# Patient Record
Sex: Female | Born: 1985 | Race: Asian | Hispanic: No | Smoking: Never smoker
Health system: Southern US, Community
[De-identification: ages and names within clinical notes are randomized; demographics above are authoritative.]

## PROBLEM LIST (undated history)

## (undated) ENCOUNTER — Inpatient Hospital Stay (HOSPITAL_COMMUNITY): Payer: Self-pay

## (undated) DIAGNOSIS — Z789 Other specified health status: Secondary | ICD-10-CM

## (undated) DIAGNOSIS — S0990XA Unspecified injury of head, initial encounter: Secondary | ICD-10-CM

## (undated) DIAGNOSIS — R55 Syncope and collapse: Secondary | ICD-10-CM

## (undated) DIAGNOSIS — R42 Dizziness and giddiness: Secondary | ICD-10-CM

## (undated) DIAGNOSIS — R519 Headache, unspecified: Secondary | ICD-10-CM

## (undated) HISTORY — DX: Dizziness and giddiness: R42

## (undated) HISTORY — DX: Headache, unspecified: R51.9

## (undated) HISTORY — DX: Unspecified injury of head, initial encounter: S09.90XA

## (undated) HISTORY — DX: Syncope and collapse: R55

## (undated) HISTORY — PX: NO PAST SURGERIES: SHX2092

---

## 2017-03-08 ENCOUNTER — Encounter (HOSPITAL_COMMUNITY): Payer: Self-pay

## 2017-03-08 ENCOUNTER — Inpatient Hospital Stay (HOSPITAL_COMMUNITY)
Admission: AD | Admit: 2017-03-08 | Discharge: 2017-03-09 | Disposition: A | Discharge status: Home / Self Care | Payer: Medicaid Other | Source: Ambulatory Visit | Attending: Obstetrics and Gynecology | Admitting: Obstetrics and Gynecology

## 2017-03-08 DIAGNOSIS — Z3A13 13 weeks gestation of pregnancy: Secondary | ICD-10-CM | POA: Insufficient documentation

## 2017-03-08 DIAGNOSIS — Z3491 Encounter for supervision of normal pregnancy, unspecified, first trimester: Secondary | ICD-10-CM

## 2017-03-08 DIAGNOSIS — O26891 Other specified pregnancy related conditions, first trimester: Secondary | ICD-10-CM | POA: Insufficient documentation

## 2017-03-08 DIAGNOSIS — W07XXXA Fall from chair, initial encounter: Secondary | ICD-10-CM | POA: Diagnosis not present

## 2017-03-08 DIAGNOSIS — R109 Unspecified abdominal pain: Secondary | ICD-10-CM | POA: Insufficient documentation

## 2017-03-08 DIAGNOSIS — O26899 Other specified pregnancy related conditions, unspecified trimester: Secondary | ICD-10-CM

## 2017-03-08 HISTORY — DX: Other specified health status: Z78.9

## 2017-03-08 NOTE — MAU Note (Signed)
Pt states she was at work tonight and fell and landed on her butt. Pt states she now has lower abdominal pain that started about 10 mins later. States the pain got better, but when she got home and finished eating, she started having more pain. Pt denies vaginal bleeding or vaginal discharge.

## 2017-03-09 DIAGNOSIS — R109 Unspecified abdominal pain: Secondary | ICD-10-CM

## 2017-03-09 DIAGNOSIS — O26892 Other specified pregnancy related conditions, second trimester: Secondary | ICD-10-CM

## 2017-03-09 LAB — URINALYSIS, ROUTINE W REFLEX MICROSCOPIC
Bilirubin Urine: NEGATIVE
GLUCOSE, UA: 50 mg/dL — AB
Hgb urine dipstick: NEGATIVE
KETONES UR: 5 mg/dL — AB
Leukocytes, UA: NEGATIVE
NITRITE: NEGATIVE
PH: 6 (ref 5.0–8.0)
Protein, ur: NEGATIVE mg/dL
Specific Gravity, Urine: 1.024 (ref 1.005–1.030)

## 2017-03-09 NOTE — Discharge Instructions (Signed)
Preventing Injuries During Pregnancy °Trauma is the most common cause of injury and death in pregnant women. This can also result in serious harm to the baby or even death. °How can injuries affect my pregnancy? °Your baby is protected in the womb (uterus) by a sac filled with fluid (amniotic sac). Your baby can be harmed if there is a direct blow to your abdomen and pelvis. Trauma may be caused by: °· Falls. These are more common in the second and third trimester of pregnancy. °· Automobile accidents. °· Domestic violence or assault. °· Severe burns, such as from fire or electricity. ° °These injuries can result in: °· Tearing of your uterus. °· The placenta pulling away from the wall of the uterus (placental abruption). °· The amniotic sac breaking open (rupture of membranes). °· Blockage or decrease in the blood supply to your baby. °· Going into labor earlier than expected. °· Severe injuries to other parts of your body, such as your brain, spine, heart, lungs, or other organs. ° °Minor falls and low-impact automobile accidents do not usually harm your baby, even if they cause a little harm to you. °What can I do to lower my risk? °Safety °· Remove slippery rugs and loose objects on the floor. They increase your risk of tripping or slipping. °· Wear comfortable shoes that have a good grip on the sole. Do not wear high-heeled shoes. °· Always wear your seat belt properly when riding in a car. Use both the lap and shoulder belt, with the lap belt below your abdomen. Always practice safe driving. Do not ride on a motorcycle while pregnant. °Activity °· Avoid walking on wet or slippery floors. °· Do not participate in rough and violent activities or sports. °· Avoid high-risk situations and activities such as: °? Lifting heavy pots of boiling or hot liquids. °? Fixing electrical problems. °? Being near fires or starting fires. °General instructions °· Take over-the-counter and prescription medicines only as told by  your health care provider. °· Know your blood type and the father's blood type in case you develop vaginal bleeding or experience an injury for which a blood transfusion is needed. °· Spousal abuse can be a serious cause of trauma during pregnancy. If you are a victim of domestic violence or assault: °? Call your local emergency services (911 in the [place] °? Contact the National Domestic Violence Hotline for help and support. °When should I seek immediate medical care? °Get help right away if: °· You fall on your abdomen or experience any serious blow to your abdomen. °· You develop stiffness in your neck or pain after a fall or from other trauma. °· You develop a headache or vision problems after a fall or from other trauma. °· You do not feel the baby moving after a fall or trauma, or you feel that the baby is not moving as much as before the fall or trauma. °· You have been the victim of domestic violence or any other kind of physical attack. °· You have been in a car accident. °· You develop vaginal bleeding. °· You have fluid leaking from the vagina. °· You develop uterine contractions. Symptoms include pelvic cramping, pain, or serious low back pain. °· You become weak, faint, or have uncontrolled vomiting after trauma. °· You have a serious burn. This includes burns to the face, neck, hands, or genitals, or burns greater than the size of your palm anywhere else. ° °Summary °· Trauma is the most common cause of   injury and death in pregnant women and can also lead to injury or death of the baby. °· Falls, automobile accidents, domestic violence or assault, and severe burns can injure you or your baby. Make sure to get medical help right away if you experience any of these during your pregnancy. °· Take steps to prevent slips or falls in your home, such as avoiding slippery floors and removing loose rugs. °· Always wear your seat belt properly when riding in a car. Practice safe driving. °This information is  not intended to replace advice given to you by your health care provider. Make sure you discuss any questions you have with your health care provider. °Document Released: 08/04/2004 Document Revised: 07/06/2016 Document Reviewed: 07/06/2016 °Elsevier Interactive Patient Education © 2017 [employer] ° °

## 2017-03-09 NOTE — MAU Provider Note (Signed)
History     CSN: [id number]  Arrival date and time: 03/08/17 2323  First Provider Initiated Contact with Patient 03/09/17 0007      Chief Complaint  Patient presents with  . Abdominal Pain   HPI  Diana Mitchell is a 31 y.o. G1P0 at [redacted]w[redacted]d who presents with abdominal pain s/p fall tonight. States she was at work around 9 pm when she fell. Was trying to sit down in a rolling chair when is rolled away from her & she landed on her bottom. Reports lower abdominal sharp pains after fall. Abdominal pain has not continued. Denies vaginal bleeding, vaginal discharge, or dysuria. Has not started prenatal care yet.   OB History    Gravida Para Term Preterm AB Living   1             SAB TAB Ectopic Multiple Live Births                  Past Medical History:  Diagnosis Date  . Medical history non-contributory     Past Surgical History:  Procedure Laterality Date  . NO PAST SURGERIES      History reviewed. No pertinent family history.  Social History  Substance Use Topics  . Smoking status: Never Smoker  . Smokeless tobacco: Never Used  . Alcohol use No    Allergies: No Known Allergies  No prescriptions prior to admission.    Review of Systems  Constitutional: Negative.   Gastrointestinal: Positive for abdominal pain (none currently). Negative for diarrhea, nausea and vomiting.  Genitourinary: Negative.    Physical Exam   Blood pressure 113/64, pulse 94, temperature 98.6 F (37 C), temperature source Oral, resp. rate 16, height 5' (1.524 m), weight 127 lb (57.6 kg), last menstrual period 11/21/2016, SpO2 100 %.  Physical Exam  Nursing note and vitals reviewed. Constitutional: She is oriented to person, place, and time. She appears well-developed and well-nourished. No distress.  HENT:  Head: Normocephalic and atraumatic.  Eyes: Conjunctivae are normal. Right eye exhibits no discharge. Left eye exhibits no discharge. No scleral icterus.  Neck: Normal range of motion.   Respiratory: Effort normal. No respiratory distress.  GI: Soft. There is no tenderness.  Genitourinary:  Genitourinary Comments: Cervix closed/thick  Neurological: She is alert and oriented to person, place, and time.  Skin: Skin is warm and dry. She is not diaphoretic.  Psychiatric: She has a normal mood and affect. Her behavior is normal. Judgment and thought content normal.    MAU Course  Procedures Results for orders placed or performed during the hospital encounter of 03/08/17 (from the past 24 hour(s))  Urinalysis, Routine w reflex microscopic     Status: Abnormal   Collection Time: 03/08/17 11:35 PM  Result Value Ref Range   Color, Urine YELLOW YELLOW   APPearance HAZY (A) CLEAR   Specific Gravity, Urine 1.024 1.005 - 1.030   pH 6.0 5.0 - 8.0   Glucose, UA 50 (A) NEGATIVE mg/dL   Hgb urine dipstick NEGATIVE NEGATIVE   Bilirubin Urine NEGATIVE NEGATIVE   Ketones, ur 5 (A) NEGATIVE mg/dL   Protein, ur NEGATIVE NEGATIVE mg/dL   Nitrite NEGATIVE NEGATIVE   Leukocytes, UA NEGATIVE NEGATIVE    MDM FHT 160 Cervix closed Patient asymptomatic at this time  Assessment and Plan  A: 1. Abdominal pain affecting pregnancy   2. Fetal heart tones present, first trimester    P; Discharge home Discussed reasons to return to MAU Start prenatal care  Judeth Hornrin Darneisha Windhorst 03/09/2017, 12:07 AM

## 2017-04-03 ENCOUNTER — Encounter: Payer: Medicaid Other | Admitting: Obstetrics

## 2017-04-03 ENCOUNTER — Encounter: Payer: Self-pay | Admitting: Obstetrics

## 2017-04-03 ENCOUNTER — Ambulatory Visit (INDEPENDENT_AMBULATORY_CARE_PROVIDER_SITE_OTHER): Payer: Medicaid Other | Admitting: Obstetrics

## 2017-04-03 ENCOUNTER — Other Ambulatory Visit (HOSPITAL_COMMUNITY)
Admission: RE | Admit: 2017-04-03 | Discharge: 2017-04-03 | Disposition: A | Discharge status: Home / Self Care | Payer: Medicaid Other | Source: Ambulatory Visit | Attending: Obstetrics | Admitting: Obstetrics

## 2017-04-03 VITALS — BP 108/74 | HR 88 | Wt 129.8 lb

## 2017-04-03 DIAGNOSIS — O9989 Other specified diseases and conditions complicating pregnancy, childbirth and the puerperium: Secondary | ICD-10-CM

## 2017-04-03 DIAGNOSIS — Z3400 Encounter for supervision of normal first pregnancy, unspecified trimester: Secondary | ICD-10-CM | POA: Insufficient documentation

## 2017-04-03 DIAGNOSIS — Z3402 Encounter for supervision of normal first pregnancy, second trimester: Secondary | ICD-10-CM | POA: Diagnosis not present

## 2017-04-03 DIAGNOSIS — Z2839 Other underimmunization status: Secondary | ICD-10-CM

## 2017-04-03 DIAGNOSIS — N898 Other specified noninflammatory disorders of vagina: Secondary | ICD-10-CM | POA: Diagnosis present

## 2017-04-03 DIAGNOSIS — E559 Vitamin D deficiency, unspecified: Secondary | ICD-10-CM

## 2017-04-03 DIAGNOSIS — Z283 Underimmunization status: Secondary | ICD-10-CM

## 2017-04-03 MED ORDER — PRENATE PIXIE 10-0.6-0.4-200 MG PO CAPS: 1.0000 | ORAL_CAPSULE | Freq: Every day | ORAL | 4 refills | Status: DC

## 2017-04-03 NOTE — Progress Notes (Signed)
Patient states she has differing dates by [place].

## 2017-04-04 NOTE — Progress Notes (Signed)
Subjective:    Diana Mitchell is being seen today for her first obstetrical visit.  This is not a planned pregnancy. She is at [redacted]w[redacted]d gestation. Her obstetrical history is significant for none. Relationship with FOB: spouse, living together. Patient does intend to breast feed. Pregnancy history fully reviewed.  The information documented in the HPI was reviewed and verified.  Menstrual History: OB History    Gravida Para Term Preterm AB Living   1             SAB TAB Ectopic Multiple Live Births                   Patient's last menstrual period was 11/21/2016.    Past Medical History:  Diagnosis Date  . Medical history non-contributory     Past Surgical History:  Procedure Laterality Date  . NO PAST SURGERIES       (Not in a hospital admission) No Known Allergies  Social History  Substance Use Topics  . Smoking status: Never Smoker  . Smokeless tobacco: Never Used  . Alcohol use No    Family History  Problem Relation Age of Onset  . Heart disease Mother   . Diabetes Father      Review of Systems Constitutional: negative for weight loss Gastrointestinal: negative for vomiting Genitourinary:negative for genital lesions and vaginal discharge and dysuria Musculoskeletal:negative for back pain Behavioral/Psych: negative for abusive relationship, depression, illegal drug usage and tobacco use    Objective:    BP 108/74   Pulse 88   Wt 129 lb 12.8 oz (58.9 kg)   LMP 11/21/2016   BMI 25.35 kg/m  General Appearance:    Alert, cooperative, no distress, appears stated age  Head:    Normocephalic, without obvious abnormality, atraumatic  Eyes:    PERRL, conjunctiva/corneas clear, EOM's intact, fundi    benign, both eyes  Ears:    Normal TM's and external ear canals, both ears  Nose:   Nares normal, septum midline, mucosa normal, no drainage    or sinus tenderness  Throat:   Lips, mucosa, and tongue normal; teeth and gums normal  Neck:   Supple, symmetrical, trachea  midline, no adenopathy;    thyroid:  no enlargement/tenderness/nodules; no carotid   bruit or JVD  Back:     Symmetric, no curvature, ROM normal, no CVA tenderness  Lungs:     Clear to auscultation bilaterally, respirations unlabored  Chest Wall:    No tenderness or deformity   Heart:    Regular rate and rhythm, S1 and S2 normal, no murmur, rub   or gallop  Breast Exam:    No tenderness, masses, or nipple abnormality  Abdomen:     Soft, non-tender, bowel sounds active all four quadrants,    no masses, no organomegaly  Genitalia:    Normal female without lesion, discharge or tenderness  Extremities:   Extremities normal, atraumatic, no cyanosis or edema  Pulses:   2+ and symmetric all extremities  Skin:   Skin color, texture, turgor normal, no rashes or lesions  Lymph nodes:   Cervical, supraclavicular, and axillary nodes normal  Neurologic:   CNII-XII intact, normal strength, sensation and reflexes    throughout      Lab Review Urine pregnancy test Labs reviewed yes Radiologic studies reviewed yes  Assessment:    Pregnancy at [redacted]w[redacted]d weeks    Plan:      Prenatal vitamins.  Counseling provided regarding continued use of seat belts, cessation  of alcohol consumption, smoking or use of illicit drugs; infection precautions i.e., influenza/TDAP immunizations, toxoplasmosis,CMV, parvovirus, listeria and varicella; workplace safety, exercise during pregnancy; routine dental care, safe medications, sexual activity, hot tubs, saunas, pools, travel, caffeine use, fish and methlymercury, potential toxins, hair treatments, varicose veins Weight gain recommendations per IOM guidelines reviewed: underweight/BMI< 18.5--> gain 28 - 40 lbs; normal weight/BMI 18.5 - 24.9--> gain 25 - 35 lbs; overweight/BMI 25 - 29.9--> gain 15 - 25 lbs; obese/BMI >30->gain  11 - 20 lbs Problem list reviewed and updated. FIRST/CF mutation testing/NIPT/QUAD SCREEN/fragile X/Ashkenazi Jewish population testing/Spinal  muscular atrophy discussed: declined. Role of ultrasound in pregnancy discussed; fetal survey: requested. Amniocentesis discussed: not indicated. VBAC calculator score: VBAC consent form provided Meds ordered this encounter  Medications  . Prenatal Vit-Fe Fumarate-FA (MULTIVITAMIN-PRENATAL) 27-0.8 MG TABS tablet    Sig: Take 1 tablet by mouth daily at 12 noon.  . Prenat-FeAsp-Meth-FA-DHA w/o A (PRENATE PIXIE) 10-0.6-0.4-200 MG CAPS    Sig: Take 1 capsule by mouth daily before breakfast.    Dispense:  90 capsule    Refill:  4   Orders Placed This Encounter  Procedures  . Culture, OB Urine  . [place] MFM OB COMP + 14 WK    Standing Status:   Future    Standing Expiration Date:   06/03/2018    Order Specific Question:   Reason for Exam (SYMPTOM  OR DIAGNOSIS REQUIRED)    Answer:   Unsure LMP    Order Specific Question:   Preferred imaging location?    Answer:   [facility]-Ultrasound  . HIV antibody  . Hemoglobinopathy evaluation  . Varicella zoster antibody, IgG  . VITAMIN D 25 Hydroxy (Vit-D Deficiency, Fractures)  . Prenatal Profile I    Follow up in 4 weeks. 50% of 20 min visit spent on counseling and coordination of care.

## 2017-04-05 LAB — CERVICOVAGINAL ANCILLARY ONLY
BACTERIAL VAGINITIS: NEGATIVE
CHLAMYDIA, DNA PROBE: NEGATIVE
Candida vaginitis: NEGATIVE
NEISSERIA GONORRHEA: NEGATIVE
TRICH (WINDOWPATH): NEGATIVE

## 2017-04-05 LAB — CYTOLOGY - PAP: Diagnosis: NEGATIVE

## 2017-04-05 LAB — URINE CULTURE, OB REFLEX

## 2017-04-05 LAB — CULTURE, OB URINE

## 2017-04-06 ENCOUNTER — Other Ambulatory Visit: Payer: Self-pay | Admitting: Obstetrics

## 2017-04-06 ENCOUNTER — Telehealth: Payer: Self-pay

## 2017-04-06 DIAGNOSIS — E559 Vitamin D deficiency, unspecified: Secondary | ICD-10-CM

## 2017-04-06 LAB — PRENATAL PROFILE I(LABCORP)
ANTIBODY SCREEN: NEGATIVE
Basophils Absolute: 0 10*3/uL (ref 0.0–0.2)
Basos: 0 %
EOS (ABSOLUTE): 0.1 10*3/uL (ref 0.0–0.4)
EOS: 1 %
HEMATOCRIT: 34.7 % (ref 34.0–46.6)
HEP B S AG: NEGATIVE
Hemoglobin: 11.1 g/dL (ref 11.1–15.9)
IMMATURE GRANULOCYTES: 0 %
Immature Grans (Abs): 0 10*3/uL (ref 0.0–0.1)
LYMPHS ABS: 3.2 10*3/uL — AB (ref 0.7–3.1)
Lymphs: 24 %
MCH: 20.2 pg — AB (ref 26.6–33.0)
MCHC: 32 g/dL (ref 31.5–35.7)
MCV: 63 fL — AB (ref 79–97)
Monocytes Absolute: 0.6 10*3/uL (ref 0.1–0.9)
Monocytes: 4 %
NEUTROS PCT: 71 %
Neutrophils Absolute: 9.7 10*3/uL — ABNORMAL HIGH (ref 1.4–7.0)
Platelets: 332 10*3/uL (ref 150–379)
RBC: 5.5 x10E6/uL — AB (ref 3.77–5.28)
RDW: 17.1 % — ABNORMAL HIGH (ref 12.3–15.4)
RH TYPE: POSITIVE
RPR: NONREACTIVE
Rubella Antibodies, IGG: <0.9 index — ABNORMAL LOW (ref >0.99)
WBC: 13.6 10*3/uL — ABNORMAL HIGH (ref 3.4–10.8)

## 2017-04-06 LAB — HEMOGLOBINOPATHY EVALUATION
HGB C: 0 %
HGB S: 0 %
HGB VARIANT: 0 %
Hemoglobin A2 Quantitation: 2.2 % (ref 1.8–3.2)
Hemoglobin F Quantitation: 0 % (ref 0.0–2.0)
Hgb A: 97.8 % (ref 96.4–98.8)

## 2017-04-06 LAB — VARICELLA ZOSTER ANTIBODY, IGG: VARICELLA: 3841 {index} (ref >165)

## 2017-04-06 LAB — VITAMIN D 25 HYDROXY (VIT D DEFICIENCY, FRACTURES): Vit D, 25-Hydroxy: 16.9 ng/mL — ABNORMAL LOW (ref 30.0–100.0)

## 2017-04-06 LAB — HIV ANTIBODY (ROUTINE TESTING W REFLEX): HIV SCREEN 4TH GENERATION: NONREACTIVE

## 2017-04-06 MED ORDER — VITAMIN D 50 MCG (2000 UT) PO CAPS: 1.0000 | ORAL_CAPSULE | Freq: Every day | ORAL | 5 refills | Status: DC

## 2017-04-06 NOTE — Telephone Encounter (Signed)
-----   Message from Brock Bad, MD sent at 04/06/2017  6:48 AM EDT ----- Vitamin D Rx Rubella non immune

## 2017-04-06 NOTE — Telephone Encounter (Signed)
Unable to reach patient as her phone does not accept incoming calls.

## 2017-04-07 ENCOUNTER — Other Ambulatory Visit (HOSPITAL_COMMUNITY): Payer: Medicaid Other

## 2017-04-14 DIAGNOSIS — Z283 Underimmunization status: Secondary | ICD-10-CM | POA: Insufficient documentation

## 2017-04-14 DIAGNOSIS — E559 Vitamin D deficiency, unspecified: Secondary | ICD-10-CM | POA: Insufficient documentation

## 2017-04-14 DIAGNOSIS — O9989 Other specified diseases and conditions complicating pregnancy, childbirth and the puerperium: Secondary | ICD-10-CM

## 2017-04-14 DIAGNOSIS — O09899 Supervision of other high risk pregnancies, unspecified trimester: Secondary | ICD-10-CM | POA: Insufficient documentation

## 2017-04-17 ENCOUNTER — Other Ambulatory Visit: Payer: Self-pay | Admitting: Obstetrics

## 2017-04-17 ENCOUNTER — Ambulatory Visit (HOSPITAL_COMMUNITY)
Admission: RE | Admit: 2017-04-17 | Discharge: 2017-04-17 | Disposition: A | Discharge status: Home / Self Care | Payer: Medicaid Other | Source: Ambulatory Visit | Attending: Obstetrics | Admitting: Obstetrics

## 2017-04-17 DIAGNOSIS — Z3687 Encounter for antenatal screening for uncertain dates: Secondary | ICD-10-CM | POA: Insufficient documentation

## 2017-04-17 DIAGNOSIS — O358XX0 Maternal care for other (suspected) fetal abnormality and damage, not applicable or unspecified: Secondary | ICD-10-CM | POA: Insufficient documentation

## 2017-04-17 DIAGNOSIS — Z3400 Encounter for supervision of normal first pregnancy, unspecified trimester: Secondary | ICD-10-CM

## 2017-04-17 DIAGNOSIS — Z3A18 18 weeks gestation of pregnancy: Secondary | ICD-10-CM | POA: Insufficient documentation

## 2017-04-17 DIAGNOSIS — Z363 Encounter for antenatal screening for malformations: Secondary | ICD-10-CM | POA: Insufficient documentation

## 2017-04-24 NOTE — Progress Notes (Signed)
Unable to contact pt.; got a recording  "at the subscriber's request this phone does not accept incoming calls"

## 2017-04-26 ENCOUNTER — Telehealth: Payer: Self-pay

## 2017-04-26 NOTE — Telephone Encounter (Signed)
Phone does not accept incoming calls.

## 2017-04-26 NOTE — Telephone Encounter (Signed)
-----   Message from Brock Badharles A Harper, MD sent at 04/06/2017  6:48 AM EDT ----- Vitamin D Rx Rubella non immune

## 2017-04-26 NOTE — Progress Notes (Signed)
Letter sent to patient.

## 2017-05-01 ENCOUNTER — Ambulatory Visit (INDEPENDENT_AMBULATORY_CARE_PROVIDER_SITE_OTHER): Payer: Medicaid Other | Admitting: Certified Nurse Midwife

## 2017-05-01 VITALS — BP 113/68 | HR 89 | Wt 136.0 lb

## 2017-05-01 DIAGNOSIS — O09899 Supervision of other high risk pregnancies, unspecified trimester: Secondary | ICD-10-CM

## 2017-05-01 DIAGNOSIS — O9989 Other specified diseases and conditions complicating pregnancy, childbirth and the puerperium: Secondary | ICD-10-CM

## 2017-05-01 DIAGNOSIS — Z283 Underimmunization status: Secondary | ICD-10-CM

## 2017-05-01 DIAGNOSIS — Z3400 Encounter for supervision of normal first pregnancy, unspecified trimester: Secondary | ICD-10-CM

## 2017-05-01 DIAGNOSIS — E559 Vitamin D deficiency, unspecified: Secondary | ICD-10-CM

## 2017-05-01 NOTE — Patient Instructions (Addendum)
Pregnancy and Rubella Rubella is a viral infection that can be very harmful to a pregnant woman and her fetus. A rubella infection in the first trimester increases the risk of losing the fetus by miscarriage or stillbirth. It also increases the risk of premature delivery and the risk of the baby having severe birth defects, such as deafness, cataracts, congenital heart disease, or intellectual disabilities. Rubella has less effect on the fetus if the mother contracts the disease later in pregnancy, but it affects all stages of pregnancy. What are the causes? This condition is caused by a virus. The virus can spread from person to person (is contagious) through coughing or sneezing. Pregnant women can pass the virus to their fetus. What are the signs or symptoms? Symptoms of this condition include:  Fever.  Red rash is typically the first sign. It usually starts on the face and spreads to the body.  Headache.  Pink eye.  Sore throat, cough, runny nose.  Swollen lymph glands.  Pain and swelling of joints.  How is this diagnosed? This condition is diagnosed based on:  Your symptoms and a physical exam.  Blood tests to confirm the diagnosis.  How is this treated? There is no effective treatment for a pregnant woman who is infected with rubella. Immune globulin may be given, but its effectiveness is unknown. After you deliver your newborn, you may be given the rubella vaccine. Follow these instructions at home: If you have rubella or your child has rubella, take these actions to help stop it from spreading:  Stay home or keep your baby home for 7 days after the rash starts.  Wash your hands often with soap and water. If soap and water are not available, use hand sanitizer.  Keep all follow-up visits as told by your health care provider. This is important.  Contact a health care provider if:  You have a fever.  You have inflammation and discharge from your eyes or nose.  You  have a sore throat.  You have a cough.  You have headaches.  You have enlarged lymph nodes.  You have worsening joint or muscle aches. Get help right away if:  You have severe pain in your abdomen.  You no longer feel your baby moving.  You have vaginal bleeding.  You have chest pain.  You have difficulty breathing.  You have large bruises. This information is not intended to replace advice given to you by your health care provider. Make sure you discuss any questions you have with your health care provider. Document Released: 10/03/2000 Document Revised: 02/24/2016 Document Reviewed: 02/01/2016 Elsevier Interactive Patient Education  2018 [employer] Measles, Adult Measles is a contagious respiratory illness that causes a red rash to appear on the skin. What are the causes? This condition is caused by a virus called rubeola. It can spread from one person to another through droplets released into the air when a person with the condition talks, coughs, or sneezes. You can get this condition by breathing in these droplets or by touching a surface where the infected droplets fell and then touching your mouth or nose. Infected air droplets are contagious for two hours. What increases the risk? Measles is more common in children than adults, but adults can get it if they have not been vaccinated or have never had the illness. What are the signs or symptoms? Symptoms of this condition include:  Fever.  White spots inside the mouth (Koplik spots).  Red, runny eyes (conjunctivitis) that  might be extra sensitive to bright light.  Sneezing or coughing.  A sore throat.  A red rash that starts on the face and spreads to the body.  Symptoms usually begin 8-10 days after coming into contact with the virus. The rash is the last symptom to develop and lasts 3-5 days. In rare cases, there is no rash. How is this diagnosed? This condition may be diagnosed based on your symptoms and a  physical exam. Sometimes blood tests area also done. How is this treated? This condition goes away on its own, usually within two weeks of symptoms starting. Treatment aims to relieve symptoms and prevent complications from happening. Treatment may include:  Rest.  Medicines.  Using a humidifier.  Follow these instructions at home:  Rest.  Drink enough fluid to keep your urine clear or pale yellow.  Keep the lights low if bright lights bother you.  Keep a humidifier in your room, if possible. This can help relieve your cough.  Take over-the-counter and prescription medicines only as told by your health care provider.  Keep all follow-up visits as told by your health care provider. This is important.  Stay away from others until four days after the rash appears. This helps prevent others from getting measles.  Be aware that measles cases are often reported to a public health agency. You may be contacted by a public health department and asked questions about how you got infected. How is this prevented? Measles can be prevented with a vaccine. If you have had measles, you cannot get it again and do not need a vaccine. If you are exposed to measles and did not receive a vaccine or have not had measles, you may be able to get a vaccine or an antibody shot within six days of exposure to prevent infection. Get help right away if:  You have ear pain or a headache.  You are breathing rapidly or strangely.  You have chest pain.  You have shortness of breath.  You are confused.  You have a seizure.  You feel nauseous or you vomit.  Your measles symptoms do not go away in two weeks.  You have symptoms of another illness. This information is not intended to replace advice given to you by your health care provider. Make sure you discuss any questions you have with your health care provider.    AREA PEDIATRIC/FAMILY PRACTICE PHYSICIANS  [facility] [address] [place], [place]  [address] Phone - [phone]   Fax - [phone]  [facility] [address] [place], [place] [address] Phone - [phone]   Fax - [phone]  [facility]OS [address] [place], [place]  [address] Phone - [phone]   Fax - [phone]  [facility] [address] [place], [place]  [address] Phone - [phone]   Fax - [phone]  [facility] [address] [place], [place]  [address] Phone - [phone]   Fax - [phone]  [facility] [address] [place], [place]  [address] Phone - [phone]   Fax - [phone]  [facility] [place] [facility] [address] [place], [place]  [address] Phone - [phone]   Fax - [phone]  [facility] [address] [place], [address] [place], [place]  [address] Phone - [phone]   Fax - [phone]  [facility] [address]  [place], [place]  [address] Phone - [phone]   Fax - [phone] [facility] [place] [address] [place], [place]  [address] Phone - [phone]   Fax - [phone]  [facility] [address] [place], [place]  [address] Phone - [phone]   Fax - [phone]  [facility] [address] [place], [place]  [address] Phone - [phone]   Fax - [phone]  [facility] [place] [address] [place], [place]  [address] Phone - [phone]   Fax - [phone]  [facility] [address] [place], [place]  [address] Phone - [phone]  [facility] [address] [place], [place]  [address] Phone - [phone]   Fax - [phone]  [facility] [address] [place], [place]  [address] Phone - [phone]   Fax -  [phone]  [facility] [address] [place], [place]  [address] Phone - [phone]   Fax - [phone]  [facility] [address] [place], [place]  [address] Phone - [phone]   Fax - [phone]   [facility] [address] [place], [place]  [address] Phone - [phone]   Fax - [phone]  [facility] [address] [place], [place]  [address] Phone - [phone]   Fax - [phone]  DAVID RUBIN [address] [place], [place]  [address] Phone - [phone]   Fax - [phone]  [facility] [address] [place], [place]  [address] Phone - [phone]   Fax - [phone]  [facility] Alita Chyle [address] [place], [place]  [address] Phone - [phone]   Fax - [phone] Gerarda Fraction [address] [place], [place]  [address] Phone - [phone]   Fax - [phone]  [facility] [address] [place], [place]  [address] Phone - [phone]   Fax - [phone]  [facility] [place] [address] [place], [place]  [address] Phone - [phone]   Fax - [phone]  [facility] - [place] Wyvonne Lenz MD [address] [place] [address] Phone [phone]  Fax [phone]   Document Released: 03/21/2012 Document Revised: 12/03/2015 Document Reviewed: 06/21/2015 Elsevier Interactive Patient Education  2018 [employer] Inc.  Second Trimester of Pregnancy The second trimester is from week 13 through week 28, month 4 through 6. This is often the time in pregnancy that you feel your best. Often times, morning sickness has lessened or quit. You may have more energy, and you may get hungry more often. Your unborn baby (fetus) is growing rapidly. At the end of the sixth month, he or she is about 9 inches long and weighs about 1  pounds. You will likely feel the baby move (quickening) between 18 and 20 weeks of pregnancy. Follow these instructions at home:  Avoid all smoking, herbs, and alcohol. Avoid drugs not approved by your doctor.  Do not use any tobacco products, including cigarettes, chewing tobacco, and electronic cigarettes. If you need help quitting, ask your doctor. You may get counseling or other support to help you quit.  Only take medicine as told by your doctor. Some medicines are safe and some are not during pregnancy.  Exercise only as told by your doctor. Stop exercising if you start having cramps.  Eat regular, healthy meals.  Wear  a good support bra if your breasts are tender.  Do not use hot tubs, steam rooms, or saunas.  Wear your seat belt when driving.  Avoid raw meat, uncooked cheese, and liter boxes and soil used by cats.  Take your prenatal vitamins.  Take 1500-2000 milligrams of calcium daily starting at the 20th week of pregnancy until you deliver your baby.  Try taking medicine that helps you poop (stool softener) as needed, and if your doctor approves. Eat more fiber by eating fresh fruit, vegetables, and whole grains. Drink enough fluids to keep your pee (urine) clear or pale yellow.  Take warm water baths (sitz baths) to soothe pain or discomfort caused by hemorrhoids. Use hemorrhoid cream if your doctor approves.  If you have puffy, bulging veins (varicose veins), wear support hose. Raise (elevate) your feet for 15 minutes, 3-4 times a day. Limit salt in your diet.  Avoid heavy lifting, wear low heals, and sit up straight.  Rest with your legs raised if you have leg cramps or low back pain.  Visit your dentist if you have not gone during your pregnancy. Use a soft toothbrush to brush your teeth. Be gentle when you floss.  You can have sex (intercourse) unless your doctor tells you not to.  Go to your doctor visits. Get help if:  You feel dizzy.  You have mild  cramps or pressure in your lower belly (abdomen).  You have a nagging pain in your belly area.  You continue to feel sick to your stomach (nauseous), throw up (vomit), or have watery poop (diarrhea).  You have bad smelling fluid coming from your vagina.  You have pain with peeing (urination). Get help right away if:  You have a fever.  You are leaking fluid from your vagina.  You have spotting or bleeding from your vagina.  You have severe belly cramping or pain.  You lose or gain weight rapidly.  You have trouble catching your breath and have chest pain.  You notice sudden or extreme puffiness (swelling) of your face, hands, ankles, feet, or legs.  You have not felt the baby move in over an hour.  You have severe headaches that do not go away with medicine.  You have vision changes. This information is not intended to replace advice given to you by your health care provider. Make sure you discuss any questions you have with your health care provider. Document Released: 09/21/2009 Document Revised: 12/03/2015 Document Reviewed: 08/28/2012 Elsevier Interactive Patient Education  2017 [employer].

## 2017-05-01 NOTE — Progress Notes (Signed)
   PRENATAL VISIT NOTE  Subjective:  Diana Mitchell is a 31 y.o. G1P0 at 47w5dbeing seen today for ongoing prenatal care.  She is currently monitored for the following issues for this low-risk pregnancy and has Supervision of normal first pregnancy, antepartum; Rubella non-immune status, antepartum; and Vitamin D deficiency on her problem list.  Patient reports no complaints.  Contractions: Not present. Vag. Bleeding: None.  Movement: Present. Denies leaking of fluid.   The following portions of the patient's history were reviewed and updated as appropriate: allergies, current medications, past family history, past medical history, past social history, past surgical history and problem list. Problem list updated.  Objective:   Vitals:   05/01/17 1505  BP: 113/68  Pulse: 89  Weight: 136 lb (61.7 kg)    Fetal Status: Fetal Heart Rate (bpm): 152; doppler Fundal Height: 19 cm Movement: Present     General:  Alert, oriented and cooperative. Patient is in no acute distress.  Skin: Skin is warm and dry. No rash noted.   Cardiovascular: Normal heart rate noted  Respiratory: Normal respiratory effort, no problems with respiration noted  Abdomen: Soft, gravid, appropriate for gestational age.  Pain/Pressure: Absent     Pelvic: Cervical exam deferred        Extremities: Normal range of motion.     Mental Status:  Normal mood and affect. Normal behavior. Normal judgment and thought content.   Assessment and Plan:  Pregnancy: G1P0 at 239w5d1. Supervision of normal first pregnancy, antepartum      Doing well.   - MaterniT21 PLUS Core+SCA - AFP, Serum, Open Spina Bifida  2. Rubella non-immune status, antepartum     MMR postpartum  3. Vitamin D deficiency      Taking weekly vitamin D.  Preterm labor symptoms and general obstetric precautions including but not limited to vaginal bleeding, contractions, leaking of fluid and fetal movement were reviewed in detail with the patient. Please  refer to After Visit Summary for other counseling recommendations.  Return in about 4 weeks (around 05/29/2017) for ROB.   RaMorene CrockerCNM

## 2017-05-01 NOTE — Progress Notes (Signed)
Pt states she is feeling "stretching" movement in her lower pelvic area. Pt denies cramping/pressure. Pt made aware of lab results today. Pt states she is taking Vit D.  Pt states that she doesn't think she needs vaccine for Rubella.  Pt made aware lab work shows she is non immune and may want to consider vaccination after pregnancy.

## 2017-05-06 LAB — MATERNIT21 PLUS CORE+SCA
Chromosome 13: NEGATIVE
Chromosome 18: NEGATIVE
Chromosome 21: NEGATIVE
Y Chromosome: DETECTED

## 2017-05-08 ENCOUNTER — Other Ambulatory Visit: Payer: Self-pay | Admitting: Certified Nurse Midwife

## 2017-05-08 DIAGNOSIS — Z3400 Encounter for supervision of normal first pregnancy, unspecified trimester: Secondary | ICD-10-CM

## 2017-05-08 LAB — AFP, SERUM, OPEN SPINA BIFIDA
AFP MoM: 2.36
AFP Value: 154.6 ng/mL
GEST. AGE ON COLLECTION DATE: 20.7 wk
MATERNAL AGE AT EDD: 29.5 a
OSBR Risk 1 IN: 378
TEST RESULTS AFP: NEGATIVE
Weight: 136 [lb_av]

## 2017-05-17 ENCOUNTER — Encounter (HOSPITAL_COMMUNITY): Payer: Self-pay | Admitting: *Deleted

## 2017-05-17 ENCOUNTER — Inpatient Hospital Stay (HOSPITAL_COMMUNITY)
Admission: AD | Admit: 2017-05-17 | Discharge: 2017-05-17 | Disposition: A | Discharge status: Home / Self Care | Payer: Medicaid Other | Source: Ambulatory Visit | Attending: Obstetrics & Gynecology | Admitting: Obstetrics & Gynecology

## 2017-05-17 DIAGNOSIS — Z283 Underimmunization status: Secondary | ICD-10-CM

## 2017-05-17 DIAGNOSIS — W07XXXA Fall from chair, initial encounter: Secondary | ICD-10-CM | POA: Diagnosis not present

## 2017-05-17 DIAGNOSIS — Z3400 Encounter for supervision of normal first pregnancy, unspecified trimester: Secondary | ICD-10-CM

## 2017-05-17 DIAGNOSIS — M7918 Myalgia, other site: Secondary | ICD-10-CM | POA: Diagnosis not present

## 2017-05-17 DIAGNOSIS — Z3A23 23 weeks gestation of pregnancy: Secondary | ICD-10-CM | POA: Insufficient documentation

## 2017-05-17 DIAGNOSIS — O26892 Other specified pregnancy related conditions, second trimester: Secondary | ICD-10-CM | POA: Diagnosis not present

## 2017-05-17 DIAGNOSIS — R109 Unspecified abdominal pain: Secondary | ICD-10-CM | POA: Diagnosis not present

## 2017-05-17 DIAGNOSIS — R102 Pelvic and perineal pain: Secondary | ICD-10-CM | POA: Diagnosis present

## 2017-05-17 DIAGNOSIS — Z2839 Other underimmunization status: Secondary | ICD-10-CM

## 2017-05-17 DIAGNOSIS — O26899 Other specified pregnancy related conditions, unspecified trimester: Secondary | ICD-10-CM

## 2017-05-17 DIAGNOSIS — O26891 Other specified pregnancy related conditions, first trimester: Secondary | ICD-10-CM | POA: Diagnosis not present

## 2017-05-17 DIAGNOSIS — O9989 Other specified diseases and conditions complicating pregnancy, childbirth and the puerperium: Secondary | ICD-10-CM

## 2017-05-17 DIAGNOSIS — W19XXXA Unspecified fall, initial encounter: Secondary | ICD-10-CM

## 2017-05-17 DIAGNOSIS — O9A212 Injury, poisoning and certain other consequences of external causes complicating pregnancy, second trimester: Secondary | ICD-10-CM

## 2017-05-17 LAB — URINALYSIS, ROUTINE W REFLEX MICROSCOPIC
Bilirubin Urine: NEGATIVE
Glucose, UA: NEGATIVE mg/dL
Hgb urine dipstick: NEGATIVE
Ketones, ur: 20 mg/dL — AB
LEUKOCYTES UA: NEGATIVE
NITRITE: NEGATIVE
PH: 7 (ref 5.0–8.0)
Protein, ur: NEGATIVE mg/dL
SPECIFIC GRAVITY, URINE: 1.014 (ref 1.005–1.030)

## 2017-05-17 LAB — WET PREP, GENITAL
CLUE CELLS WET PREP: NONE SEEN
SPERM: NONE SEEN
TRICH WET PREP: NONE SEEN
Yeast Wet Prep HPF POC: NONE SEEN

## 2017-05-17 NOTE — MAU Note (Signed)
Pt reports yesterday at 3 pm she fell off of a chair into the floor , states she landed on her knees and did not hit her abd. States last pm she began having lower abd pain and vaginal pain. The pain has continued until today. Denies bleeding.

## 2017-05-17 NOTE — MAU Provider Note (Signed)
History     CSN: [id number]  Arrival date & time 05/17/17  1527   First Provider Initiated Contact with Patient 05/17/17 1710      Chief Complaint  Patient presents with  . Fall  . Abdominal Pain  . Vaginal Pain    Pt is a 31 yo G1P0 at 6078w0d who presents with a complaint of lower abdominal pain. Pt reports that she fell out of a chair while reaching for something. She caught herself and landed on her left knee. Around 3 am this morning, she started having an intermittent abdominal pain. She has had associated vaginal pain, but denies any fever, headache, dizziness, back pain, vaginal bleeding or discharge, painful urination or fever.     Past Medical History:  Diagnosis Date  . Medical history non-contributory     Past Surgical History:  Procedure Laterality Date  . NO PAST SURGERIES      Family History  Problem Relation Age of Onset  . Heart disease Mother   . Diabetes Father     Social History   Tobacco Use  . Smoking status: Never Smoker  . Smokeless tobacco: Never Used  Substance Use Topics  . Alcohol use: No  . Drug use: No    OB History    Gravida Para Term Preterm AB Living   1             SAB TAB Ectopic Multiple Live Births                  Review of Systems  Constitutional: Negative for activity change, appetite change and fever.  HENT: Negative for congestion.   Eyes: Negative for discharge and visual disturbance.  Respiratory: Negative for chest tightness, shortness of breath and wheezing.   Cardiovascular: Negative for chest pain and leg swelling.  Gastrointestinal: Positive for abdominal pain. Negative for abdominal distention, blood in stool, nausea and vomiting.  Genitourinary: Positive for pelvic pain and vaginal pain. Negative for difficulty urinating, dysuria, hematuria, vaginal bleeding and vaginal discharge.  Musculoskeletal: Negative for arthralgias and back pain.  Skin: Negative for color change and rash.  Neurological: Negative  for dizziness and headaches.  Psychiatric/Behavioral: Negative for agitation.    Allergies  Patient has no known allergies.  Home Medications    BP (!) 101/55 (BP Location: Right Arm)   Pulse 72   Temp 97.6 F (36.4 C) (Oral)   Resp 15   Ht 4\' 11"  (1.499 m)   Wt 62.6 kg (138 lb)   LMP 11/21/2016   SpO2 100%   BMI 27.87 kg/m   Physical Exam  Constitutional: She is oriented to person, place, and time. She appears well-developed and well-nourished. No distress.  HENT:  Head: Normocephalic and atraumatic.  Eyes: Conjunctivae are normal. Right eye exhibits no discharge. Left eye exhibits no discharge.  Neck: Normal range of motion. Neck supple.  Cardiovascular: Normal rate, regular rhythm and normal heart sounds. Exam reveals no gallop and no friction rub.  No murmur heard. Pulmonary/Chest: Breath sounds normal. No respiratory distress. She has no wheezes. She exhibits no tenderness.  Abdominal: Soft. Bowel sounds are normal. She exhibits no distension. There is tenderness. There is no rebound and no guarding.  Genitourinary: Vaginal discharge found.  Musculoskeletal: Normal range of motion. She exhibits no edema.  Neurological: She is alert and oriented to person, place, and time.  Skin: Skin is warm and dry. No rash noted. She is not diaphoretic.  Psychiatric: She has a normal  mood and affect.    MAU Course  Procedures (including critical care time)  Labs Reviewed  URINALYSIS, ROUTINE W REFLEX MICROSCOPIC - Abnormal; Notable for the following components:      Result Value   APPearance HAZY (*)    Ketones, ur 20 (*)    All other components within normal limits  WET PREP, GENITAL  GC/CHLAMYDIA PROBE AMP ([place]) NOT AT [facility]   Results for orders placed or performed during the hospital encounter of 05/17/17 (from the past 24 hour(s))  Urinalysis, Routine w reflex microscopic     Status: Abnormal   Collection Time: 05/17/17  3:50 PM  Result Value Ref Range   Color,  Urine YELLOW YELLOW   APPearance HAZY (A) CLEAR   Specific Gravity, Urine 1.014 1.005 - 1.030   pH 7.0 5.0 - 8.0   Glucose, UA NEGATIVE NEGATIVE mg/dL   Hgb urine dipstick NEGATIVE NEGATIVE   Bilirubin Urine NEGATIVE NEGATIVE   Ketones, ur 20 (A) NEGATIVE mg/dL   Protein, ur NEGATIVE NEGATIVE mg/dL   Nitrite NEGATIVE NEGATIVE   Leukocytes, UA NEGATIVE NEGATIVE  Wet prep, genital     Status: Abnormal   Collection Time: 05/17/17  5:04 PM  Result Value Ref Range   Yeast Wet Prep HPF POC NONE SEEN NONE SEEN   Trich, Wet Prep NONE SEEN NONE SEEN   Clue Cells Wet Prep HPF POC NONE SEEN NONE SEEN   WBC, Wet Prep HPF POC MODERATE (A) NONE SEEN   Sperm NONE SEEN    Blood type A positive  No results found.   1. Rubella non-immune status, antepartum   2. Supervision of normal first pregnancy, antepartum       MDM  This visit, including physical exam, was done in my presence and I agree with  the above student's note.   MDM: Pt is >12 hours from fall in which she did not hit her abdomen.  FHR tracing wnl today.  Pain is likely musculoskeletal/round ligament pain.  Rest/ice/heat/warm bath/pregnancy support belt/Tylenol for pain.  Keep scheduled appt in office. Return to MAU as needed for emergencies.  A: 1. Traumatic injury during pregnancy in second trimester   2. Rubella non-immune status, antepartum   3. Supervision of normal first pregnancy, antepartum   4. Fall, initial encounter   5. Pain of round ligament affecting pregnancy, antepartum   6. Musculoskeletal pain     P: D/C home with precautions  Sharen CounterLisa Leftwich-Kirby, CNM 9:32 PM  LEFTWICH-KIRBY, LISA Certified Nurse-Midwife

## 2017-05-18 LAB — GC/CHLAMYDIA PROBE AMP (~~LOC~~) NOT AT ARMC
CHLAMYDIA, DNA PROBE: NEGATIVE
Neisseria Gonorrhea: NEGATIVE

## 2017-05-29 ENCOUNTER — Encounter: Payer: Self-pay | Admitting: Certified Nurse Midwife

## 2017-05-29 ENCOUNTER — Ambulatory Visit (INDEPENDENT_AMBULATORY_CARE_PROVIDER_SITE_OTHER): Payer: Medicaid Other | Admitting: Certified Nurse Midwife

## 2017-05-29 VITALS — BP 102/68 | HR 83 | Wt 142.0 lb

## 2017-05-29 DIAGNOSIS — O09899 Supervision of other high risk pregnancies, unspecified trimester: Secondary | ICD-10-CM

## 2017-05-29 DIAGNOSIS — E559 Vitamin D deficiency, unspecified: Secondary | ICD-10-CM

## 2017-05-29 DIAGNOSIS — Z3400 Encounter for supervision of normal first pregnancy, unspecified trimester: Secondary | ICD-10-CM

## 2017-05-29 DIAGNOSIS — Z3402 Encounter for supervision of normal first pregnancy, second trimester: Secondary | ICD-10-CM

## 2017-05-29 DIAGNOSIS — O9989 Other specified diseases and conditions complicating pregnancy, childbirth and the puerperium: Secondary | ICD-10-CM

## 2017-05-29 DIAGNOSIS — Z283 Underimmunization status: Secondary | ICD-10-CM

## 2017-05-29 NOTE — Progress Notes (Signed)
PRENATAL VISIT NOTE  Subjective:  Diana Mitchell is a 31 y.o. G1P0 at [redacted]w[redacted]d being seen today for ongoing prenatal care.  She is currently monitored for the following issues for this low-risk pregnancy and has Supervision of normal first pregnancy, antepartum; Rubella non-immune status, antepartum; and Vitamin D deficiency on their problem list.  Patient reports no complaints.  Contractions: Not present. Vag. Bleeding: None.  Movement: Present. Denies leaking of fluid.   The following portions of the patient's history were reviewed and updated as appropriate: allergies, current medications, past family history, past medical history, past social history, past surgical history and problem list. Problem list updated.  Objective:   Vitals:   05/29/17 1507  BP: 102/68  Pulse: 83  Weight: 142 lb (64.4 kg)    Fetal Status: Fetal Heart Rate (bpm): 142; doppler Fundal Height: 24 cm Movement: Present     General:  Alert, oriented and cooperative. Patient is in no acute distress.  Skin: Skin is warm and dry. No rash noted.   Cardiovascular: Normal heart rate noted  Respiratory: Normal respiratory effort, no problems with respiration noted  Abdomen: Soft, gravid, appropriate for gestational age.  Pain/Pressure: Absent     Pelvic: Cervical exam deferred        Extremities: Normal range of motion.     Mental Status:  Normal mood and affect. Normal behavior. Normal judgment and thought content.   Assessment and Plan:  Pregnancy: G1P0 at [redacted]w[redacted]d  1. Supervision of normal first pregnancy, antepartum     Doing well  2. Rubella non-immune status, antepartum      MMR postpartum  3. Vitamin D deficiency      Taking weekly vitamin D.   Preterm labor symptoms and general obstetric precautions including but not limited to vaginal bleeding, contractions, leaking of fluid and fetal movement were reviewed in detail with the patient. Please refer to After Visit Summary for other counseling  recommendations.  Return in about 3 weeks (around 06/19/2017) for ROB, 2 hr OGTT.   Rachelle A Denney, CNM  

## 2017-06-19 ENCOUNTER — Other Ambulatory Visit: Payer: Medicaid Other

## 2017-06-19 ENCOUNTER — Encounter: Payer: Medicaid Other | Admitting: Certified Nurse Midwife

## 2017-06-20 ENCOUNTER — Ambulatory Visit (INDEPENDENT_AMBULATORY_CARE_PROVIDER_SITE_OTHER): Payer: Medicaid Other | Admitting: Certified Nurse Midwife

## 2017-06-20 ENCOUNTER — Other Ambulatory Visit: Payer: Medicaid Other

## 2017-06-20 ENCOUNTER — Encounter: Payer: Self-pay | Admitting: Certified Nurse Midwife

## 2017-06-20 VITALS — BP 112/74 | HR 78 | Wt 143.0 lb

## 2017-06-20 DIAGNOSIS — O9989 Other specified diseases and conditions complicating pregnancy, childbirth and the puerperium: Secondary | ICD-10-CM

## 2017-06-20 DIAGNOSIS — Z3402 Encounter for supervision of normal first pregnancy, second trimester: Secondary | ICD-10-CM

## 2017-06-20 DIAGNOSIS — Z283 Underimmunization status: Secondary | ICD-10-CM

## 2017-06-20 DIAGNOSIS — E559 Vitamin D deficiency, unspecified: Secondary | ICD-10-CM

## 2017-06-20 DIAGNOSIS — O09899 Supervision of other high risk pregnancies, unspecified trimester: Secondary | ICD-10-CM

## 2017-06-20 DIAGNOSIS — Z3400 Encounter for supervision of normal first pregnancy, unspecified trimester: Secondary | ICD-10-CM

## 2017-06-20 NOTE — Progress Notes (Signed)
   PRENATAL VISIT NOTE  Subjective:  Diana Mitchell is a 31 y.o. G1P0 at 34w6dbeing seen today for ongoing prenatal care.  She is currently monitored for the following issues for this low-risk pregnancy and has Supervision of normal first pregnancy, antepartum; Rubella non-immune status, antepartum; and Vitamin D deficiency on their problem list.  Patient reports no complaints.  Contractions: Not present. Vag. Bleeding: None.  Movement: Present. Denies leaking of fluid.   The following portions of the patient's history were reviewed and updated as appropriate: allergies, current medications, past family history, past medical history, past social history, past surgical history and problem list. Problem list updated.  Objective:   Vitals:   06/20/17 1011  BP: 112/74  Pulse: 78  Weight: 143 lb (64.9 kg)    Fetal Status: Fetal Heart Rate (bpm): 143; doppler Fundal Height: 28 cm Movement: Present     General:  Alert, oriented and cooperative. Patient is in no acute distress.  Skin: Skin is warm and dry. No rash noted.   Cardiovascular: Normal heart rate noted  Respiratory: Normal respiratory effort, no problems with respiration noted  Abdomen: Soft, gravid, appropriate for gestational age.  Pain/Pressure: Present     Pelvic: Cervical exam deferred        Extremities: Normal range of motion.     Mental Status:  Normal mood and affect. Normal behavior. Normal judgment and thought content.   Assessment and Plan:  Pregnancy: G1P0 at 285w6d1. Supervision of normal first pregnancy, antepartum      2 Hr OGTT today  2. Rubella non-immune status, antepartum     MMR postpartum  3. Vitamin D deficiency     Taking weekly vitamin D  Preterm labor symptoms and general obstetric precautions including but not limited to vaginal bleeding, contractions, leaking of fluid and fetal movement were reviewed in detail with the patient. Please refer to After Visit Summary for other counseling  recommendations.  Return in about 2 weeks (around 07/04/2017) for ROB.   RaMorene CrockerCNM

## 2017-06-20 NOTE — Progress Notes (Signed)
Pt states she is having some lower back/hip and ?ligament pain.

## 2017-06-20 NOTE — Patient Instructions (Signed)
AREA PEDIATRIC/FAMILY PRACTICE PHYSICIANS  [facility] [address], [address] [place], [place]  [address] Phone - [phone]   Fax - [phone]  [facility] [address] [place], [place] [address] Phone - [phone]   Fax - [phone]  [facility] [address] [place], [place]  [address] Phone - [phone]   Fax - [phone]  [facility] [address] [place], [place]  [address] Phone - [phone]   Fax - [phone]  [facility] [address] [place], [place]  [address] Phone - [phone]   Fax - [phone]  [facility] [address] [place], [place]  [address] Phone - [phone]   Fax - [phone]  [facility] [place] [address] [place], [place]  [address] Phone - [phone]   Fax - [phone]  [facility] [address] [place], [place]  [address] Phone - [phone]   Fax - [phone]  [facility] [address] [place], [place]  [address] Phone - [phone]   Fax - [phone] [facility] [address] [place], [place]  [address] Phone - [phone]   Fax - [phone]  [facility] [address] [place], [place]  [address] Phone - [phone]   Fax - [phone]  [facility] [address] [place], [place]  [address] Phone - [phone]   Fax - [phone]  [facility] [address] [place], [place]  [address] Phone - [phone]   Fax - [phone]  SHIL[facility] GOSRANI [address] [place], [place]  [address] Phone - [phone]  [facility] [address] [place], [place]  [address] Phone - [phone]   Fax - [phone]  [facility] [address] [place], [place]  [address] Phone - [phone]   Fax - [phone]  [facility] [address] [place], [place]  [address] Phone - [phone]   Fax - [phone]  [facility] [address] [place], [place]  [address] Phone - [phone]   Fax - [phone]   [facility] [address] [place], [place]  [address] Phone - [phone]   Fax - [phone]  [facility] [address] [place], [place]  [address] Phone - [phone]   Fax - [phone]  DAVID RUBIN [address] [place], [place]  [address] Phone - [phone]   Fax - [phone]  [facility] [address] [place], [place]  [address] Phone - [phone]   Fax - [phone]  [facility] [address] [place], [place]  [address] Phone - [phone]   Fax - [phone] [facility] [address] [place], [place]  [address] Phone - [phone]   Fax - [phone]  [facility] [address] [place], [place]  [address] Phone - [phone]   Fax - [phone]  [facility] [address] [place],   [address] Phone - [phone]   Fax - [phone]  [facility] Charlene Flemming MD [address] [place]  [address] Phone [phone]  Fax [phone]   

## 2017-06-27 LAB — CBC
HEMATOCRIT: 33 % — AB (ref 34.0–46.6)
HEMOGLOBIN: 10.1 g/dL — AB (ref 11.1–15.9)
MCH: 20.4 pg — AB (ref 26.6–33.0)
MCHC: 30.6 g/dL — AB (ref 31.5–35.7)
MCV: 67 fL — AB (ref 79–97)
Platelets: 302 10*3/uL (ref 150–379)
RBC: 4.95 x10E6/uL (ref 3.77–5.28)
RDW: 17.4 % — ABNORMAL HIGH (ref 12.3–15.4)
WBC: 10.9 10*3/uL — ABNORMAL HIGH (ref 3.4–10.8)

## 2017-06-27 LAB — GLUCOSE TOLERANCE, 2 HOURS W/ 1HR
GLUCOSE, 1 HOUR: 169 mg/dL (ref 65–179)
Glucose, 2 hour: 116 mg/dL (ref 65–152)
Glucose, Fasting: 80 mg/dL (ref 65–91)

## 2017-06-27 LAB — HIV ANTIBODY (ROUTINE TESTING W REFLEX): HIV SCREEN 4TH GENERATION: NONREACTIVE

## 2017-06-27 LAB — RPR: RPR Ser Ql: NONREACTIVE

## 2017-06-28 ENCOUNTER — Telehealth: Payer: Self-pay | Admitting: Pediatrics

## 2017-06-28 NOTE — Telephone Encounter (Signed)
No treatment or testing needed.  This is a childhood illness that she probably has had exposure to in the past.  Should not affect the pregnancy in anyway.  Mild symptoms are typical cold like/flu like.  OTC tylenol for fever.  If rash should disappear in a week.  Thanks [employer]

## 2017-06-28 NOTE — Telephone Encounter (Signed)
I called patient back and advised of recommendations. She voiced understanding.

## 2017-06-28 NOTE — Telephone Encounter (Signed)
Pt called stating concern that she has been exposed to fifth's dz.  She states her niece was diagnosed with it yesterday and she has long term exposure to child.  Pt would like to know if there is any specific blood work or testing she should have done. Please advise.

## 2017-06-29 ENCOUNTER — Other Ambulatory Visit: Payer: Self-pay | Admitting: Certified Nurse Midwife

## 2017-06-29 DIAGNOSIS — Z3400 Encounter for supervision of normal first pregnancy, unspecified trimester: Secondary | ICD-10-CM

## 2017-06-29 DIAGNOSIS — O99013 Anemia complicating pregnancy, third trimester: Secondary | ICD-10-CM | POA: Insufficient documentation

## 2017-06-29 MED ORDER — CITRANATAL BLOOM 90-1 MG PO TABS: 1.0000 | ORAL_TABLET | Freq: Every day | ORAL | 12 refills | Status: DC

## 2017-07-05 ENCOUNTER — Ambulatory Visit (INDEPENDENT_AMBULATORY_CARE_PROVIDER_SITE_OTHER): Payer: Medicaid Other | Admitting: Certified Nurse Midwife

## 2017-07-05 DIAGNOSIS — Z3400 Encounter for supervision of normal first pregnancy, unspecified trimester: Secondary | ICD-10-CM

## 2017-07-05 DIAGNOSIS — Z3403 Encounter for supervision of normal first pregnancy, third trimester: Secondary | ICD-10-CM

## 2017-07-05 NOTE — Progress Notes (Signed)
   PRENATAL VISIT NOTE  Subjective:  Diana Mitchell is a 31 y.o. G1P0 at 2965w0d being seen today for ongoing prenatal care.  She is currently monitored for the following issues for this low-risk pregnancy and has Supervision of normal first pregnancy, antepartum; Rubella non-immune status, antepartum; Vitamin D deficiency; and Anemia affecting pregnancy in third trimester on their problem list.  Patient reports no complaints.  Contractions: Not present. Vag. Bleeding: None.  Movement: Present. Denies leaking of fluid.   The following portions of the patient's history were reviewed and updated as appropriate: allergies, current medications, past family history, past medical history, past social history, past surgical history and problem list. Problem list updated.  Objective:   Vitals:   07/05/17 1526  BP: 108/76  Pulse: 96  Weight: 146 lb (66.2 kg)    Fetal Status: Fetal Heart Rate (bpm): 147; doppler Fundal Height: 28 cm Movement: Present     General:  Alert, oriented and cooperative. Patient is in no acute distress.  Skin: Skin is warm and dry. No rash noted.   Cardiovascular: Normal heart rate noted  Respiratory: Normal respiratory effort, no problems with respiration noted  Abdomen: Soft, gravid, appropriate for gestational age.  Pain/Pressure: Present     Pelvic: Cervical exam deferred        Extremities: Normal range of motion.     Mental Status:  Normal mood and affect. Normal behavior. Normal judgment and thought content.   Assessment and Plan:  Pregnancy: G1P0 at 2965w0d  1. Supervision of normal first pregnancy, antepartum -Educated and discussed increasing iron in food. HGB 10.1 on 12/11- patient declines iron PO at this time, plans to repeat CBC around 36 weeks   Preterm labor symptoms and general obstetric precautions including but not limited to vaginal bleeding, contractions, leaking of fluid and fetal movement were reviewed in detail with the patient. Please refer to  After Visit Summary for other counseling recommendations.  Return in about 2 weeks (around 07/19/2017) for ROB.   Sharyon CableVeronica C Kaydynce Pat, CNM

## 2017-07-05 NOTE — Patient Instructions (Signed)

## 2017-07-11 NOTE — L&D Delivery Note (Signed)
Patient is 32 y.o. G1P0 6777w4d admitted for PPROM/SOL. Prenatal course also complicated by anemia, rubella non-immune.  Delivery Note At 11:45 PM a viable female was delivered via Vaginal, Spontaneous (Presentation: ROA ).  APGAR: 8, 9; weight pending.   Placenta status: Intact,.  Cord: 3V with the following complications: None.  Cord pH: N/A  Anesthesia: None   Episiotomy: None Lacerations: 1st degree;Perineal Suture Repair: 3.0 Vicryl Est. Blood Loss (mL): 200  Mom to postpartum.  Baby to Couplet care / Skin to Skin.  Caryl AdaJazma Rhaya Coale, DO 08/20/2017, 12:36 AM

## 2017-07-18 ENCOUNTER — Ambulatory Visit (INDEPENDENT_AMBULATORY_CARE_PROVIDER_SITE_OTHER): Payer: Medicaid Other | Admitting: Certified Nurse Midwife

## 2017-07-18 ENCOUNTER — Encounter: Payer: Self-pay | Admitting: Certified Nurse Midwife

## 2017-07-18 VITALS — Wt 150.2 lb

## 2017-07-18 DIAGNOSIS — Z283 Underimmunization status: Secondary | ICD-10-CM

## 2017-07-18 DIAGNOSIS — Z3403 Encounter for supervision of normal first pregnancy, third trimester: Secondary | ICD-10-CM

## 2017-07-18 DIAGNOSIS — Z3400 Encounter for supervision of normal first pregnancy, unspecified trimester: Secondary | ICD-10-CM

## 2017-07-18 DIAGNOSIS — E559 Vitamin D deficiency, unspecified: Secondary | ICD-10-CM

## 2017-07-18 DIAGNOSIS — O09899 Supervision of other high risk pregnancies, unspecified trimester: Secondary | ICD-10-CM

## 2017-07-18 DIAGNOSIS — O9989 Other specified diseases and conditions complicating pregnancy, childbirth and the puerperium: Secondary | ICD-10-CM

## 2017-07-18 NOTE — Progress Notes (Signed)
Patient reports good fetal movement, denies pain. 

## 2017-07-18 NOTE — Progress Notes (Signed)
   PRENATAL VISIT NOTE  Subjective:  Diana Mitchell is a 32 y.o. G1P0 at 16w6dbeing seen today for ongoing prenatal care.  She is currently monitored for the following issues for this low-risk pregnancy and has Supervision of normal first pregnancy, antepartum; Rubella non-immune status, antepartum; Vitamin D deficiency; and Anemia affecting pregnancy in third trimester on their problem list.  Patient reports no complaints.  Contractions: Not present. Vag. Bleeding: None.  Movement: Present. Denies leaking of fluid.   The following portions of the patient's history were reviewed and updated as appropriate: allergies, current medications, past family history, past medical history, past social history, past surgical history and problem list. Problem list updated.  Objective:   Vitals:   07/18/17 1516  Weight: 150 lb 3.2 oz (68.1 kg)    Fetal Status: Fetal Heart Rate (bpm): 145; doppler Fundal Height: 31 cm Movement: Present     General:  Alert, oriented and cooperative. Patient is in no acute distress.  Skin: Skin is warm and dry. No rash noted.   Cardiovascular: Normal heart rate noted  Respiratory: Normal respiratory effort, no problems with respiration noted  Abdomen: Soft, gravid, appropriate for gestational age.  Pain/Pressure: Absent     Pelvic: Cervical exam deferred        Extremities: Normal range of motion.  Edema: None  Mental Status:  Normal mood and affect. Normal behavior. Normal judgment and thought content.   Assessment and Plan:  Pregnancy: G1P0 at 333w6d1. Supervision of normal first pregnancy, antepartum     Doing well.  2. Vitamin D deficiency     Taking weekly vitamin D  3. Rubella non-immune status, antepartum     MMR postpartum  Preterm labor symptoms and general obstetric precautions including but not limited to vaginal bleeding, contractions, leaking of fluid and fetal movement were reviewed in detail with the patient. Please refer to After Visit  Summary for other counseling recommendations.  Return in about 2 weeks (around 08/01/2017) for ROB.   RaMorene CrockerCNM

## 2017-08-01 ENCOUNTER — Encounter: Payer: Self-pay | Admitting: Certified Nurse Midwife

## 2017-08-01 ENCOUNTER — Ambulatory Visit (INDEPENDENT_AMBULATORY_CARE_PROVIDER_SITE_OTHER): Payer: Medicaid Other | Admitting: Certified Nurse Midwife

## 2017-08-01 VITALS — BP 99/65 | HR 73 | Wt 156.0 lb

## 2017-08-01 DIAGNOSIS — Z283 Underimmunization status: Secondary | ICD-10-CM

## 2017-08-01 DIAGNOSIS — O9989 Other specified diseases and conditions complicating pregnancy, childbirth and the puerperium: Secondary | ICD-10-CM

## 2017-08-01 DIAGNOSIS — E559 Vitamin D deficiency, unspecified: Secondary | ICD-10-CM

## 2017-08-01 DIAGNOSIS — Z3400 Encounter for supervision of normal first pregnancy, unspecified trimester: Secondary | ICD-10-CM

## 2017-08-01 DIAGNOSIS — O09899 Supervision of other high risk pregnancies, unspecified trimester: Secondary | ICD-10-CM

## 2017-08-01 NOTE — Progress Notes (Signed)
Pt states this morning she had chest pain and SOB.

## 2017-08-01 NOTE — Patient Instructions (Addendum)

## 2017-08-01 NOTE — Progress Notes (Signed)
   PRENATAL VISIT NOTE  Subjective:  Diana Mitchell is a 32 y.o. G1P0 at 39w6dbeing seen today for ongoing prenatal care.  She is currently monitored for the following issues for this low-risk pregnancy and has Supervision of normal first pregnancy, antepartum; Rubella non-immune status, antepartum; Vitamin D deficiency; and Anemia affecting pregnancy in third trimester on their problem list.  Patient reports no bleeding, no contractions, no cramping, no leaking and states some chest pain this morning that went away after eating, [facility]. patient verbalized understanding.  Contractions: Irregular. Vag. Bleeding: None.  Movement: Present. Denies leaking of fluid.   The following portions of the patient's history were reviewed and updated as appropriate: allergies, current medications, past family history, past medical history, past social history, past surgical history and problem list. Problem list updated.  Objective:   Vitals:   08/01/17 1517  BP: 99/65  Pulse: 73  Weight: 156 lb (70.8 kg)    Fetal Status: Fetal Heart Rate (bpm): 140; doppler Fundal Height: 33 cm Movement: Present     General:  Alert, oriented and cooperative. Patient is in no acute distress.  Skin: Skin is warm and dry. No rash noted.   Cardiovascular: Normal heart rate noted  Respiratory: Normal respiratory effort, no problems with respiration noted  Abdomen: Soft, gravid, appropriate for gestational age.  Pain/Pressure: Absent     Pelvic: Cervical exam deferred        Extremities: Normal range of motion.  Edema: None  Mental Status:  Normal mood and affect. Normal behavior. Normal judgment and thought content.   Assessment and Plan:  Pregnancy: G1P0 at 357w6d1. Supervision of normal first pregnancy, antepartum     Doing well  2. Vitamin D deficiency     Taking weekly vitamin D  3. Rubella non-immune status, antepartum     MMR postpartum  Preterm labor  symptoms and general obstetric precautions including but not limited to vaginal bleeding, contractions, leaking of fluid and fetal movement were reviewed in detail with the patient. Please refer to After Visit Summary for other counseling recommendations.  Return in about 2 weeks (around 08/15/2017) for ROB, GBS.   RaMorene CrockerCNM

## 2017-08-15 ENCOUNTER — Other Ambulatory Visit (HOSPITAL_COMMUNITY)
Admission: RE | Admit: 2017-08-15 | Discharge: 2017-08-15 | Disposition: A | Discharge status: Home / Self Care | Payer: Medicaid Other | Source: Ambulatory Visit | Attending: Certified Nurse Midwife | Admitting: Certified Nurse Midwife

## 2017-08-15 ENCOUNTER — Ambulatory Visit (INDEPENDENT_AMBULATORY_CARE_PROVIDER_SITE_OTHER): Payer: Medicaid Other | Admitting: Certified Nurse Midwife

## 2017-08-15 ENCOUNTER — Encounter: Payer: Self-pay | Admitting: Certified Nurse Midwife

## 2017-08-15 VITALS — BP 106/66 | HR 81 | Wt 162.1 lb

## 2017-08-15 DIAGNOSIS — Z3400 Encounter for supervision of normal first pregnancy, unspecified trimester: Secondary | ICD-10-CM

## 2017-08-15 DIAGNOSIS — Z283 Underimmunization status: Secondary | ICD-10-CM

## 2017-08-15 DIAGNOSIS — O9989 Other specified diseases and conditions complicating pregnancy, childbirth and the puerperium: Secondary | ICD-10-CM

## 2017-08-15 DIAGNOSIS — O99013 Anemia complicating pregnancy, third trimester: Secondary | ICD-10-CM

## 2017-08-15 DIAGNOSIS — Z3A00 Weeks of gestation of pregnancy not specified: Secondary | ICD-10-CM | POA: Diagnosis not present

## 2017-08-15 DIAGNOSIS — E559 Vitamin D deficiency, unspecified: Secondary | ICD-10-CM

## 2017-08-15 DIAGNOSIS — O09899 Supervision of other high risk pregnancies, unspecified trimester: Secondary | ICD-10-CM

## 2017-08-15 NOTE — Progress Notes (Signed)
Pt denies concerns at this time. 

## 2017-08-15 NOTE — Progress Notes (Signed)
   PRENATAL VISIT NOTE  Subjective:  Diana Mitchell is a 32 y.o. G1P0 at 18w6dbeing seen today for ongoing prenatal care.  She is currently monitored for the following issues for this low-risk pregnancy and has Supervision of normal first pregnancy, antepartum; Rubella non-immune status, antepartum; Vitamin D deficiency; and Anemia affecting pregnancy in third trimester on their problem list.  Patient reports no complaints.  Contractions: Irregular. Vag. Bleeding: None.  Movement: Present. Denies leaking of fluid.   The following portions of the patient's history were reviewed and updated as appropriate: allergies, current medications, past family history, past medical history, past social history, past surgical history and problem list. Problem list updated.  Objective:   Vitals:   08/15/17 1538  BP: 106/66  Pulse: 81  Weight: 162 lb 1.6 oz (73.5 kg)    Fetal Status: Fetal Heart Rate (bpm): 156; doppler Fundal Height: 35 cm Movement: Present  Presentation: Vertex  General:  Alert, oriented and cooperative. Patient is in no acute distress.  Skin: Skin is warm and dry. No rash noted.   Cardiovascular: Normal heart rate noted  Respiratory: Normal respiratory effort, no problems with respiration noted  Abdomen: Soft, gravid, appropriate for gestational age.  Pain/Pressure: Absent     Pelvic: Cervical exam performed Dilation: Closed Effacement (%): 50 Station: -3  Extremities: Normal range of motion.  Edema: None  Mental Status:  Normal mood and affect. Normal behavior. Normal judgment and thought content.   Assessment and Plan:  Pregnancy: G1P0 at 339w6d1. Supervision of normal first pregnancy, antepartum      Doing well - Strep Gp B NAA - Cervicovaginal ancillary only - CBC  2. Rubella non-immune status, antepartum     MMR postpartum  3. Anemia affecting pregnancy in third trimester      Taking Bloom  4. Vitamin D deficiency     Taking weekly vitamin D  Preterm labor  symptoms and general obstetric precautions including but not limited to vaginal bleeding, contractions, leaking of fluid and fetal movement were reviewed in detail with the patient. Please refer to After Visit Summary for other counseling recommendations.  Return in about 1 week (around 08/22/2017) for ROB.   RaMorene CrockerCNM

## 2017-08-16 LAB — CERVICOVAGINAL ANCILLARY ONLY
Bacterial vaginitis: NEGATIVE
CANDIDA VAGINITIS: NEGATIVE
Chlamydia: NEGATIVE
NEISSERIA GONORRHEA: NEGATIVE
TRICH (WINDOWPATH): NEGATIVE

## 2017-08-16 LAB — CBC
Hematocrit: 36 % (ref 34.0–46.6)
Hemoglobin: 11.1 g/dL (ref 11.1–15.9)
MCH: 20.1 pg — ABNORMAL LOW (ref 26.6–33.0)
MCHC: 30.8 g/dL — AB (ref 31.5–35.7)
MCV: 65 fL — ABNORMAL LOW (ref 79–97)
Platelets: 241 10*3/uL (ref 150–379)
RBC: 5.51 x10E6/uL — ABNORMAL HIGH (ref 3.77–5.28)
RDW: 18.7 % — AB (ref 12.3–15.4)
WBC: 10.2 10*3/uL (ref 3.4–10.8)

## 2017-08-17 LAB — STREP GP B NAA: STREP GROUP B AG: NEGATIVE

## 2017-08-19 ENCOUNTER — Other Ambulatory Visit: Payer: Self-pay

## 2017-08-19 ENCOUNTER — Inpatient Hospital Stay (HOSPITAL_COMMUNITY)
Admission: AD | Admit: 2017-08-19 | Discharge: 2017-08-21 | DRG: 805 | Disposition: A | Discharge status: Home / Self Care | Payer: Medicaid Other | Source: Ambulatory Visit | Attending: Obstetrics and Gynecology | Admitting: Obstetrics and Gynecology

## 2017-08-19 ENCOUNTER — Encounter (HOSPITAL_COMMUNITY): Payer: Self-pay

## 2017-08-19 DIAGNOSIS — O9989 Other specified diseases and conditions complicating pregnancy, childbirth and the puerperium: Secondary | ICD-10-CM

## 2017-08-19 DIAGNOSIS — O9902 Anemia complicating childbirth: Secondary | ICD-10-CM | POA: Diagnosis present

## 2017-08-19 DIAGNOSIS — O4290 Premature rupture of membranes, unspecified as to length of time between rupture and onset of labor, unspecified weeks of gestation: Secondary | ICD-10-CM | POA: Diagnosis present

## 2017-08-19 DIAGNOSIS — Z283 Underimmunization status: Secondary | ICD-10-CM

## 2017-08-19 DIAGNOSIS — O42913 Preterm premature rupture of membranes, unspecified as to length of time between rupture and onset of labor, third trimester: Principal | ICD-10-CM | POA: Diagnosis present

## 2017-08-19 DIAGNOSIS — O42013 Preterm premature rupture of membranes, onset of labor within 24 hours of rupture, third trimester: Secondary | ICD-10-CM | POA: Diagnosis not present

## 2017-08-19 DIAGNOSIS — O09899 Supervision of other high risk pregnancies, unspecified trimester: Secondary | ICD-10-CM

## 2017-08-19 DIAGNOSIS — D649 Anemia, unspecified: Secondary | ICD-10-CM | POA: Diagnosis present

## 2017-08-19 DIAGNOSIS — Z3A36 36 weeks gestation of pregnancy: Secondary | ICD-10-CM

## 2017-08-19 DIAGNOSIS — Z3400 Encounter for supervision of normal first pregnancy, unspecified trimester: Secondary | ICD-10-CM

## 2017-08-19 LAB — CBC
HEMATOCRIT: 33.5 % — AB (ref 36.0–46.0)
HEMOGLOBIN: 11.1 g/dL — AB (ref 12.0–15.0)
MCH: 20.6 pg — ABNORMAL LOW (ref 26.0–34.0)
MCHC: 33.1 g/dL (ref 30.0–36.0)
MCV: 62.3 fL — ABNORMAL LOW (ref 78.0–100.0)
Platelets: 178 10*3/uL (ref 150–400)
RBC: 5.38 MIL/uL — AB (ref 3.87–5.11)
RDW: 17.4 % — ABNORMAL HIGH (ref 11.5–15.5)
WBC: 9.9 10*3/uL (ref 4.0–10.5)

## 2017-08-19 LAB — TYPE AND SCREEN
ABO/RH(D): A POS
Antibody Screen: NEGATIVE

## 2017-08-19 MED ORDER — BETAMETHASONE SOD PHOS & ACET 6 (3-3) MG/ML IJ SUSP
12.0000 mg | INTRAMUSCULAR | Status: DC
  Administered 2017-08-19: 12 mg via INTRAMUSCULAR
  Filled 2017-08-19: qty 2

## 2017-08-19 MED ORDER — OXYTOCIN BOLUS FROM INFUSION
500.0000 mL | Freq: Once | INTRAVENOUS | Status: AC
  Administered 2017-08-19: 500 mL via INTRAVENOUS

## 2017-08-19 MED ORDER — ONDANSETRON HCL 4 MG/2ML IJ SOLN: 4.0000 mg | Freq: Four times a day (QID) | INTRAMUSCULAR | Status: DC | PRN

## 2017-08-19 MED ORDER — HYDROXYZINE HCL 50 MG PO TABS
50.0000 mg | ORAL_TABLET | Freq: Four times a day (QID) | ORAL | Status: DC | PRN
  Filled 2017-08-19: qty 1

## 2017-08-19 MED ORDER — FENTANYL CITRATE (PF) 100 MCG/2ML IJ SOLN: 50.0000 ug | INTRAMUSCULAR | Status: DC | PRN

## 2017-08-19 MED ORDER — SODIUM CHLORIDE 0.9% FLUSH: 3.0000 mL | Freq: Two times a day (BID) | INTRAVENOUS | Status: DC

## 2017-08-19 MED ORDER — OXYTOCIN 40 UNITS IN LACTATED RINGERS INFUSION - SIMPLE MED
2.5000 [IU]/h | INTRAVENOUS | Status: DC
  Administered 2017-08-20: 2.5 [IU]/h via INTRAVENOUS
  Filled 2017-08-19: qty 1000

## 2017-08-19 MED ORDER — LACTATED RINGERS IV SOLN
INTRAVENOUS | Status: DC
  Administered 2017-08-19: 22:00:00 via INTRAVENOUS

## 2017-08-19 MED ORDER — SODIUM CHLORIDE 0.9 % IV SOLN: 250.0000 mL | INTRAVENOUS | Status: DC | PRN

## 2017-08-19 MED ORDER — LACTATED RINGERS IV SOLN
500.0000 mL | INTRAVENOUS | Status: DC | PRN
  Administered 2017-08-19: 500 mL via INTRAVENOUS

## 2017-08-19 MED ORDER — OXYTOCIN 10 UNIT/ML IJ SOLN: 10.0000 [IU] | Freq: Once | INTRAMUSCULAR | Status: DC

## 2017-08-19 MED ORDER — LIDOCAINE HCL (PF) 1 % IJ SOLN
30.0000 mL | INTRAMUSCULAR | Status: DC | PRN
  Administered 2017-08-19: 30 mL via SUBCUTANEOUS
  Filled 2017-08-19: qty 30

## 2017-08-19 MED ORDER — SOD CITRATE-CITRIC ACID 500-334 MG/5ML PO SOLN: 30.0000 mL | ORAL | Status: DC | PRN

## 2017-08-19 MED ORDER — SODIUM CHLORIDE 0.9% FLUSH: 3.0000 mL | INTRAVENOUS | Status: DC | PRN

## 2017-08-19 MED ORDER — ACETAMINOPHEN 325 MG PO TABS: 650.0000 mg | ORAL_TABLET | ORAL | Status: DC | PRN

## 2017-08-19 MED ORDER — OXYCODONE-ACETAMINOPHEN 5-325 MG PO TABS: 1.0000 | ORAL_TABLET | ORAL | Status: DC | PRN

## 2017-08-19 MED ORDER — OXYCODONE-ACETAMINOPHEN 5-325 MG PO TABS: 2.0000 | ORAL_TABLET | ORAL | Status: DC | PRN

## 2017-08-19 NOTE — MAU Note (Addendum)
Pt reports LOF since1200 and contractions that are painful since 6pm

## 2017-08-19 NOTE — H&P (Signed)
OBSTETRIC ADMISSION HISTORY AND PHYSICAL  Diana Mitchell is a 32 y.o. female G1P0 with IUP at 405w3d by early US presenting for PPROM. Patient reported loss of clear fluid with minimal bleeding around noon today.  She reports +FMs, no current VB, no blurry vision, headaches or peripheral edema, and RUQ pain.  She plans on bottle feeding. She request nothing for birth control at this time.  [facility]  Prenatal History/Complications: Low-risk pregnancy Anemia in third trimester Rubella non-immune  Past Medical History: Past Medical History:  Diagnosis Date  . Medical history non-contributory     Past Surgical History: Past Surgical History:  Procedure Laterality Date  . NO PAST SURGERIES      Obstetrical History: OB History    Gravida Para Term Preterm AB Living   1             SAB TAB Ectopic Multiple Live Births                  Social History: Social History   Socioeconomic History  . Marital status: Married    Spouse name: Not on file  . Number of children: Not on file  . Years of education: Not on file  . Highest education level: Not on file  Social Needs  . Financial resource strain: Not on file  . Food insecurity - worry: Not on file  . Food insecurity - inability: Not on file  . Transportation needs - medical: Not on file  . Transportation needs - non-medical: Not on file  Occupational History  . Not on file  Tobacco Use  . Smoking status: Never Smoker  . Smokeless tobacco: Never Used  Substance and Sexual Activity  . Alcohol use: No  . Drug use: No  . Sexual activity: Yes    Birth control/protection: None  Other Topics Concern  . Not on file  Social History Narrative  . Not on file    Family History: Family History  Problem Relation Age of Onset  . Heart disease Mother   . Diabetes Father     Allergies: No Known Allergies  Medications Prior to Admission  Medication Sig Dispense Refill Last Dose  .  Cholecalciferol (VITAMIN D) 2000 units CAPS Take 1 capsule (2,000 Units total) by mouth daily before breakfast. 30 capsule 5 08/19/2017 at Unknown time  . Prenatal-DSS-FeCb-FeGl-FA (CITRANATAL BLOOM) 90-1 MG TABS Take 1 tablet by mouth daily. 30 tablet 12 08/19/2017 at Unknown time  . Prenat-FeAsp-Meth-FA-DHA w/o A (PRENATE PIXIE) 10-0.6-0.4-200 MG CAPS Take 1 capsule by mouth daily before breakfast. 90 capsule 4 Taking     Review of Systems   All systems reviewed and negative except as stated in HPI  Physical Exam:  Blood pressure 134/71, pulse (!) 104, temperature 99 F (37.2 C), resp. rate 16, last menstrual period 11/21/2016. General appearance: alert, distracted, fatigued and mild distress Lungs: clear to auscultation bilaterally Heart: regular rate and rhythm Abdomen: soft, non-tender; bowel sounds normal Pelvic: adequate Extremities: Homans sign is negative, no sign of DVT DTR's intact Presentation: cephalic Fetal monitoringBaseline: 140 bpm, Variability: Good {> 6 bpm), Accelerations: Reactive and Decelerations: Variable: mild Uterine activityFrequency: Every 5 minutes Dilation: 1.5 Effacement (%): 100 Exam by:: Marlynn Perking Lawson CNM   Prenatal labs: ABO, Rh: A/Positive/-- (09/24 1554) Antibody: Negative (09/24 1554) Rubella: <0.90 (09/24 1554) RPR: Non Reactive (12/11 1208)  HBsAg: Negative (09/24 1554)  HIV: Non Reactive (12/11 1208)  GBS: Negative (02/05 1601)  2 hr Glucola within normal range Genetic screening: negative  Anatomy US: CP cysts on [place] +14 anatomy screen but follow up cell free DNA and AFP were normal   Prenatal Transfer Tool  Maternal Diabetes: No Genetic Screening: Normal Maternal Ultrasounds/Referrals: Abnormal:  Findings:   Isolated choroid plexus cyst Fetal Ultrasounds or other Referrals:  None Maternal Substance Abuse:  No Significant Maternal Medications:  None Significant Maternal Lab Results: Lab values include: Group B Strep negative  Results for  orders placed or performed during the hospital encounter of 08/19/17 (from the past 24 hour(s))  CBC   Collection Time: 08/19/17  8:31 PM  Result Value Ref Range   WBC 9.9 4.0 - 10.5 K/uL   RBC 5.38 (H) 3.87 - 5.11 MIL/uL   Hemoglobin 11.1 (L) 12.0 - 15.0 g/dL   HCT 16.1 (L) 09.6 - 04.5 %   MCV 62.3 (L) 78.0 - 100.0 fL   MCH 20.6 (L) 26.0 - 34.0 pg   MCHC 33.1 30.0 - 36.0 g/dL   RDW 40.9 (H) 81.1 - 91.4 %   Platelets 178 150 - 400 K/uL  Type and screen [facility]   Collection Time: 08/19/17  8:33 PM  Result Value Ref Range   ABO/RH(D) A POS    Antibody Screen NEG    Sample Expiration      08/22/2017 Performed at [facility], [address] [place], [place] [address]     Patient Active Problem List   Diagnosis Date Noted  . Anemia affecting pregnancy in third trimester 06/29/2017  . Rubella non-immune status, antepartum 04/14/2017  . Vitamin D deficiency 04/14/2017  . Supervision of normal first pregnancy, antepartum 04/03/2017    Assessment/Plan:  Diana Mitchell is a 32 y.o. G1P0 at [redacted]w[redacted]d by early [place] presenting for PPROM with SOL.   #Labor: Expectant management. S/p 1 dose of BMZ in MAU.  #Pain: Nothing for pain at this time   #FWB: Category 2 #ID: GBS negative   #MOF: bottle #MOC: nothing at this time  #Circ:  Will perform outpatient    Ames Coupe, Medical Student  08/19/2017, 8:34 PM  OB FELLOW HISTORY AND PHYSICAL ATTESTATION  I confirm that I have verified the information documented in the medical student's note and that I have also personally reperformed the physical exam and all medical decision making activities. I agree with above documentation and have made edits as needed.   Caryl Ada, DO OB Fellow 08/19/2017, 10:24 PM

## 2017-08-19 NOTE — MAU Note (Signed)
Urine sent to lab 

## 2017-08-20 ENCOUNTER — Encounter (HOSPITAL_COMMUNITY): Payer: Self-pay

## 2017-08-20 ENCOUNTER — Other Ambulatory Visit: Payer: Self-pay

## 2017-08-20 DIAGNOSIS — O42013 Preterm premature rupture of membranes, onset of labor within 24 hours of rupture, third trimester: Secondary | ICD-10-CM

## 2017-08-20 DIAGNOSIS — Z3A36 36 weeks gestation of pregnancy: Secondary | ICD-10-CM

## 2017-08-20 LAB — RPR: RPR Ser Ql: NONREACTIVE

## 2017-08-20 LAB — ABO/RH: ABO/RH(D): A POS

## 2017-08-20 MED ORDER — WITCH HAZEL-GLYCERIN EX PADS
1.0000 "application " | MEDICATED_PAD | CUTANEOUS | Status: DC | PRN
  Administered 2017-08-20: 1 via TOPICAL

## 2017-08-20 MED ORDER — DIBUCAINE 1 % RE OINT
1.0000 "application " | TOPICAL_OINTMENT | RECTAL | Status: DC | PRN
  Administered 2017-08-20: 1 via RECTAL
  Filled 2017-08-20: qty 28

## 2017-08-20 MED ORDER — SIMETHICONE 80 MG PO CHEW: 80.0000 mg | CHEWABLE_TABLET | ORAL | Status: DC | PRN

## 2017-08-20 MED ORDER — MISOPROSTOL 200 MCG PO TABS
600.0000 ug | ORAL_TABLET | Freq: Once | ORAL | Status: AC
  Administered 2017-08-20: 600 ug via RECTAL

## 2017-08-20 MED ORDER — COCONUT OIL OIL: 1.0000 "application " | TOPICAL_OIL | Status: DC | PRN

## 2017-08-20 MED ORDER — ZOLPIDEM TARTRATE 5 MG PO TABS: 5.0000 mg | ORAL_TABLET | Freq: Every evening | ORAL | Status: DC | PRN

## 2017-08-20 MED ORDER — ACETAMINOPHEN 325 MG PO TABS: 650.0000 mg | ORAL_TABLET | ORAL | Status: DC | PRN

## 2017-08-20 MED ORDER — TETANUS-DIPHTH-ACELL PERTUSSIS 5-2.5-18.5 LF-MCG/0.5 IM SUSP: 0.5000 mL | Freq: Once | INTRAMUSCULAR | Status: DC

## 2017-08-20 MED ORDER — MISOPROSTOL 200 MCG PO TABS
ORAL_TABLET | ORAL | Status: AC
  Filled 2017-08-20: qty 3

## 2017-08-20 MED ORDER — PRENATAL MULTIVITAMIN CH
1.0000 | ORAL_TABLET | Freq: Every day | ORAL | Status: DC
  Administered 2017-08-20 – 2017-08-21 (×2): 1 via ORAL
  Filled 2017-08-20 (×2): qty 1

## 2017-08-20 MED ORDER — ONDANSETRON HCL 4 MG/2ML IJ SOLN: 4.0000 mg | INTRAMUSCULAR | Status: DC | PRN

## 2017-08-20 MED ORDER — BENZOCAINE-MENTHOL 20-0.5 % EX AERO
1.0000 "application " | INHALATION_SPRAY | CUTANEOUS | Status: DC | PRN
  Administered 2017-08-20: 1 via TOPICAL
  Filled 2017-08-20: qty 56

## 2017-08-20 MED ORDER — SENNOSIDES-DOCUSATE SODIUM 8.6-50 MG PO TABS
2.0000 | ORAL_TABLET | ORAL | Status: DC
  Administered 2017-08-21: 2 via ORAL
  Filled 2017-08-20: qty 2

## 2017-08-20 MED ORDER — DIPHENHYDRAMINE HCL 25 MG PO CAPS: 25.0000 mg | ORAL_CAPSULE | Freq: Four times a day (QID) | ORAL | Status: DC | PRN

## 2017-08-20 MED ORDER — IBUPROFEN 600 MG PO TABS
600.0000 mg | ORAL_TABLET | Freq: Four times a day (QID) | ORAL | Status: DC
  Administered 2017-08-20 – 2017-08-21 (×6): 600 mg via ORAL
  Filled 2017-08-20 (×6): qty 1

## 2017-08-20 MED ORDER — ONDANSETRON HCL 4 MG PO TABS: 4.0000 mg | ORAL_TABLET | ORAL | Status: DC | PRN

## 2017-08-20 NOTE — Clinical Social Work Maternal (Signed)
  CLINICAL SOCIAL WORK MATERNAL/CHILD NOTE  Patient Details  Name: Diana Mitchell MRN: [id number] Date of Birth: 02-11-1986  Date:  08/20/2017  Clinical Social Worker Initiating Note:  Zekiel Torian Lavonna Monarch lcsw Date/Time: Initiated:  08/20/17/      Child's Name:  [place]   Biological Parents:  Mother, Father   Need for Interpreter:  None   Reason for Referral:  Other (Comment)(Edinburgh score of 11)   Address:  [address] [place] [address]    Phone number:  [phone] (home)     Additional phone number:   Household Members/Support Persons (HM/SP):   Household Member/Support Person 1   HM/SP Name Relationship DOB or Age  HM/SP -1 Sabrina Filla sister    HM/SP -2        HM/SP -3        HM/SP -4        HM/SP -5        HM/SP -6        HM/SP -7        HM/SP -8          Natural Supports (not living in the home):  Immediate Family, Extended Family   Professional Supports: None   Employment: Full-time   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  LCSW met with MOB and FOB at bedside to assess for services.  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 MOB reported that she lived with the father of the baby, her mother and her sister.  MOB reported that she was nervous through her  pregnancy stating "its my first baby it's normal nervous".  MOB reported that she was instructed by her MD in November of last year to stop working so she has been at home since that time resting.  MOB denied any history of mental health diagnoses or medications.  MOB denied needing any additional mental health support stating she was "normal" and would continue to worry about her newborn but had her mother and her sister for support and questions regarding the care of her newborn. RN reported no concerns.  No referrals at this time.   CSW Plan/Description:  No Further Intervention Required/No Barriers to Discharge    Carlean Jews, LCSW 08/20/2017, 11:27 AM

## 2017-08-20 NOTE — Progress Notes (Signed)
POSTPARTUM PROGRESS NOTE  Post Partum Day 1  Subjective:  Diana Mitchell is a 32 y.o. G1P0101 s/p SVD at 46w3dafter PPROM.  Prenatal course complicated by anemia, rubella non-immune.  No acute events overnight.  Pt denies problems with ambulating, voiding or po intake.  She denies nausea or vomiting.  Pain is well controlled.  She has had flatus. She has had bowel movement.  Lochia Minimal.   Objective: Blood pressure 96/69, pulse 69, temperature 98.9 F (37.2 C), temperature source Oral, resp. rate 18, height '4\' 10"'  (1.473 m), weight 73.5 kg (162 lb), last menstrual period 11/21/2016, unknown if currently breastfeeding.  Physical Exam:  General: alert, cooperative and no distress Chest: no respiratory distress Heart:regular rate, distal pulses intact Abdomen: soft, nontender Uterine Fundus: firm, appropriately tender DVT Evaluation: No calf swelling or tenderness Extremities: no peripheral  edema   Recent Labs    08/19/17 2031  HGB 11.1*  HCT 33.5*    Assessment/Plan: Diana Mitchell is a 32y.o. G1P0101 s/p SVD at 338w3dfter PPROM.   PPD#1 - Doing well.  Has no complaints at this time.  Will discuss getting MMR with her PCP Contraception: undecided, will discuss at her next OBGYN visit Feeding: bottle Dispo: Plan for discharge tomorrow.   LOS: 1 day   ChBrendolyn PattyS3 08/20/2017, 7:33 AM

## 2017-08-20 NOTE — Progress Notes (Addendum)
Rn gave patient information regarding the TDAP and MMR to the patient . RN explained the importance of having each vaccination.

## 2017-08-20 NOTE — Progress Notes (Addendum)
Patient's edinburgh postnatal depression score was 11. Social work consult ordered. Rn sat on the side of patient's bed and discussed her answers and each question with mom. Mom stated " that things are just not the same since I became pregnant. Mom stated things have changed.

## 2017-08-21 MED ORDER — IBUPROFEN 600 MG PO TABS: 600.0000 mg | ORAL_TABLET | Freq: Four times a day (QID) | ORAL | 0 refills | Status: DC

## 2017-08-21 NOTE — Progress Notes (Signed)
POSTPARTUM PROGRESS NOTE  Post Partum Day 2  Subjective:  Diana Mitchell is a 32 y.o. G1P0101 s/p SVD at 62w3dafter PPROM.  Prenatal course complicated by anemia, rubella non-immune.  No acute events overnight.  Pt denies problems with ambulating, voiding or po intake.  She reports nausea but no vomiting last night that has resolved at this time.  Pain is well controlled.  She has had flatus. She has had bowel movement.  Lochia Minimal.   Objective: Blood pressure 93/60, pulse 64, temperature 97.7 F (36.5 C), temperature source Oral, resp. rate 18, height _0  (1.473 m), weight 73.5 kg (162 lb), last menstrual period 11/21/2016, SpO2 98 %, unknown if currently breastfeeding.  Physical Exam:  General: alert, cooperative and no distress Chest: no respiratory distress Heart:regular rate, distal pulses intact Abdomen: soft, nontender Uterine Fundus: firm, appropriately tender DVT Evaluation: No calf swelling or tenderness Extremities: no peripheral  edema   Recent Labs    08/19/17 2031  HGB 11.1*  HCT 33.5*    Assessment/Plan: Diana Mitchell is a 32y.o. G1P0101 s/p SVD at 340w3dfter PPROM.   PPD#1 - Doing well.  Has no complaints at this time.  Will discuss getting MMR with her PCP Contraception: undecided, will discuss at her next OBGYN visit Feeding: bottle Dispo: Plan for discharge today.   LOS: 2 days   ChBrendolyn PattyS3 08/21/2017, 7:39 AM

## 2017-08-21 NOTE — Discharge Instructions (Signed)
Contraception Choices °Contraception, also called birth control, means things to use or ways to try not to get pregnant. °Hormonal birth control °This kind of birth control uses hormones. Here are some types of hormonal birth control: °· A tube that is put under skin of the arm (implant). The tube can stay in for as long as 3 years. °· Shots to get every 3 months (injections). °· Pills to take every day (birth control pills). °· A patch to change 1 time each week for 3 weeks (birth control patch). After that, the patch is taken off for 1 week. °· A ring to put in the vagina. The ring is left in for 3 weeks. Then it is taken out of the vagina for 1 week. Then a new ring is put in. °· Pills to take after unprotected sex (emergency birth control pills). ° °Barrier birth control °Here are some types of barrier birth control: °· A thin covering that is put on the penis before sex (female condom). The covering is thrown away after sex. °· A soft, loose covering that is put in the vagina before sex (female condom). The covering is thrown away after sex. °· A rubber bowl that sits over the cervix (diaphragm). The bowl must be made for you. The bowl is put into the vagina before sex. The bowl is left in for 6-8 hours after sex. It is taken out within 24 hours. °· A small, soft cup that fits over the cervix (cervical cap). The cup must be made for you. The cup can be left in for 6-8 hours after sex. It is taken out within 48 hours. °· A sponge that is put into the vagina before sex. It must be left in for at least 6 hours after sex. It must be taken out within 30 hours. Then it is thrown away. °· A chemical that kills or stops sperm from getting into the uterus (spermicide). It may be a pill, cream, jelly, or foam to put in the vagina. The chemical should be used at least 10-15 minutes before sex. ° °IUD (intrauterine) birth control °An IUD is a small, T-shaped piece of plastic. It is put inside the uterus. There are two  kinds: °· Hormone IUD. This kind can stay in for 3-5 years. °· Copper IUD. This kind can stay in for 10 years. ° °Permanent birth control °Here are some types of permanent birth control: °· Surgery to block the fallopian tubes. °· Having an insert put into each fallopian tube. °· Surgery to tie off the tubes that carry sperm (vasectomy). ° °Natural planning birth control °Here are some types of natural planning birth control: °· Not having sex on the days the woman could get pregnant. °· Using a calendar: °? To keep track of the length of each period. °? To find out what days pregnancy can happen. °? To plan to not have sex on days when pregnancy can happen. °· Watching for symptoms of ovulation and not having sex during ovulation. One way the woman can check for ovulation is to check her temperature. °· Waiting to have sex until after ovulation. ° °Summary °· Contraception, also called birth control, means things to use or ways to try not to get pregnant. °· Hormonal methods of birth control include implants, injections, pills, patches, vaginal rings, and emergency birth control pills. °· Barrier methods of birth control can include female condoms, female condoms, diaphragms, cervical caps, sponges, and spermicides. °· There are two types of   IUD (intrauterine device) birth control. An IUD can be put in a woman's uterus to prevent pregnancy for 3-5 years. °· Permanent sterilization can be done through a procedure for males, females, or both. °· Natural planning methods involve not having sex on the days when the woman could get pregnant. °This information is not intended to replace advice given to you by your health care provider. Make sure you discuss any questions you have with your health care provider. °Document Released: 04/24/2009 Document Revised: 07/07/2016 Document Reviewed: 07/07/2016 °Elsevier Interactive Patient Education © 2017 [employer] Inc[employer] °Home Care Instructions for Mom °ACTIVITY °· Gradually return to  your regular activities. °· Let yourself rest. Nap while your baby sleeps. °· Avoid lifting anything that is heavier than 10 lb (4.5 kg) until your health care provider says it is okay. °· Avoid activities that take a lot of effort and energy (are strenuous) until approved by your health care provider. Walking at a slow-to-moderate pace is usually safe. °· If you had a cesarean delivery: °? Do not vacuum, climb stairs, or drive a car for 4-6 weeks. °? Have someone help you at home until you feel like you can do your usual activities yourself. °? Do exercises as told by your health care provider, if this applies. ° °VAGINAL BLEEDING °You may continue to bleed for 4-6 weeks after delivery. Over time, the amount of blood usually decreases and the color of the blood usually gets lighter. However, the flow of bright red blood may increase if you have been too active. If you need to use more than one pad in an hour because your pad gets soaked, or if you pass a large clot: °· Lie down. °· Raise your feet. °· Place a cold compress on your lower abdomen. °· Rest. °· Call your health care provider. ° °If you are breastfeeding, your period should return anytime between 8 weeks after delivery and the time that you stop breastfeeding. If you are not breastfeeding, your period should return 6-8 weeks after delivery. °PERINEAL CARE °The perineal area, or perineum, is the part of your body between your thighs. After delivery, this area needs special care. Follow these instructions as told by your health care provider. °· Take warm tub baths for 15-20 minutes. °· Use medicated pads and pain-relieving sprays and creams as told. °· Do not use tampons or douches until vaginal bleeding has stopped. °· Each time you go to the bathroom: °? Use a peri bottle. °? Change your pad. °? Use towelettes in place of toilet paper until your stitches have healed. °· Do Kegel exercises every day. Kegel exercises help to maintain the muscles that  support the vagina, bladder, and bowels. You can do these exercises while you are standing, sitting, or lying down. To do Kegel exercises: °? Tighten the muscles of your abdomen and the muscles that surround your birth canal. °? Hold for a few seconds. °? Relax. °? Repeat until you have done this 5 times in a row. °· To prevent hemorrhoids from developing or getting worse: °? Drink enough fluid to keep your urine clear or pale yellow. °? Avoid straining when having a bowel movement. °? Take over-the-counter medicines and stool softeners as told by your health care provider. ° °BREAST CARE °· Wear a tight-fitting bra. °· Avoid taking over-the-counter pain medicine for breast discomfort. °· Apply ice to the breasts to help with discomfort as needed: °? Put ice in a plastic bag. °? Place a towel between your   skin and the bag. °? Leave the ice on for 20 minutes or as told by your health care provider. ° °NUTRITION °· Eat a well-balanced diet. °· Do not try to lose weight quickly by cutting back on calories. °· Take your prenatal vitamins until your postpartum checkup or until your health care provider tells you to stop. ° °POSTPARTUM DEPRESSION °You may find yourself crying for no apparent reason and unable to cope with all of the changes that come with having a newborn. This mood is called postpartum depression. Postpartum depression happens because your hormone levels change after delivery. If you have postpartum depression, get support from your partner, friends, and family. If the depression does not go away on its own after several weeks, contact your health care provider. °BREAST SELF-EXAM °Do a breast self-exam each month, at the same time of the month. If you are breastfeeding, check your breasts just after a feeding, when your breasts are less full. If you are breastfeeding and your period has started, check your breasts on day 5, 6, or 7 of your period. °Report any lumps, bumps, or discharge to your health  care provider. Know that breasts are normally lumpy if you are breastfeeding. This is temporary, and it is not a health risk. °INTIMACY AND SEXUALITY °Avoid sexual activity for at least 3-4 weeks after delivery or until the brownish-red vaginal flow is completely gone. If you want to avoid pregnancy, use some form of birth control. You can get pregnant after delivery, even if you have not had your period. °SEEK MEDICAL CARE IF: °· You feel unable to cope with the changes that a child brings to your life, and these feelings do not go away after several weeks. °· You notice a lump, a bump, or discharge on your breast. ° °SEEK IMMEDIATE MEDICAL CARE IF: °· Blood soaks your pad in 1 hour or less. °· You have: °? Severe pain or cramping in your lower abdomen. °? A bad-smelling vaginal discharge. °? A fever that is not controlled by medicine. °? A fever, and an area of your breast is red and sore. °? Pain or redness in your calf. °? Sudden, severe chest pain. °? Shortness of breath. °? Painful or bloody urination. °? Problems with your vision. °· You vomit for 12 hours or longer. °· You develop a severe headache. °· You have serious thoughts about hurting yourself, your child, or anyone else. ° °This information is not intended to replace advice given to you by your health care provider. Make sure you discuss any questions you have with your health care provider. °Document Released: 06/24/2000 Document Revised: 12/03/2015 Document Reviewed: 12/29/2014 °Elsevier Interactive Patient Education © 2017 Elsevier Inc. ° °

## 2017-08-21 NOTE — Progress Notes (Signed)
Patient was educated on Tdap and MMR vaccines and importance of getting these.  Patient stated she did not want them right now, she feels she needs to discuss them with her primary care physician.

## 2017-08-21 NOTE — Discharge Summary (Signed)
OB Discharge Summary     Patient Name: Diana Mitchell DOB: 03/04/1986 MRN: [id number]  Date of admission: 08/19/2017 Delivering MD: Pincus LargePHELPS, Jonathan Corpus Y   Date of discharge: 08/21/2017  Admitting diagnosis: 36-37 WKS, WATER BROKE Intrauterine pregnancy: 3739w3d     Secondary diagnosis:  Active Problems:   PROM (premature rupture of membranes)   SVD (spontaneous vaginal delivery)  Additional problems:  Low-risk pregnancy Anemia in third trimester Rubella non-immune    Discharge diagnosis: Preterm Pregnancy Delivered                                                                                                Post partum procedures:None  Augmentation: None  Complications: None  Hospital course:  Onset of Labor With Vaginal Delivery     32 y.o. yo G1P0101 at 3939w3d was admitted in Active Labor on 08/19/2017. Patient had an uncomplicated labor course as follows:  Membrane Rupture Time/Date: 12:00 PM ,08/19/2017   Intrapartum Procedures: Episiotomy: None [1]                                         Lacerations:  1st degree [2];Perineal [11]  Patient had a delivery of a Viable infant. 08/19/2017  Information for the patient's newborn:  Mcdevitt, Boy Kennedy Buckerhanh [[id number]][[id number]]  Delivery Method: Vaginal, Spontaneous(Filed from Delivery Summary)    Pateint had an uncomplicated postpartum course.  She is ambulating, tolerating a regular diet, passing flatus, and urinating well. Patient is discharged home in stable condition on 08/21/17.   Physical exam  Vitals:   08/20/17 1203 08/20/17 1900 08/21/17 0110 08/21/17 0604  BP: 100/72 (!) 100/58 112/68 93/60  Pulse: 81 74 74 64  Resp: 16 20  18   Temp: 98.2 F (36.8 C) 97.8 F (36.6 C)  97.7 F (36.5 C)  TempSrc: Oral Oral  Oral  SpO2: 98%     Weight:      Height:       General: alert, cooperative and no distress Lochia: appropriate Uterine Fundus: firm Incision: N/A DVT Evaluation: No evidence of DVT seen on physical exam. No significant calf/ankle  edema. Labs: Lab Results  Component Value Date   WBC 9.9 08/19/2017   HGB 11.1 (L) 08/19/2017   HCT 33.5 (L) 08/19/2017   MCV 62.3 (L) 08/19/2017   PLT 178 08/19/2017   No flowsheet data found.  Discharge instruction: per After Visit Summary and "Baby and Me Booklet".  After visit meds:  Allergies as of 08/21/2017   No Known Allergies     Medication List    TAKE these medications   CITRANATAL BLOOM 90-1 MG Tabs Take 1 tablet by mouth daily.   ibuprofen 600 MG tablet Commonly known as:  ADVIL,MOTRIN Take 1 tablet (600 mg total) by mouth every 6 (six) hours.   Vitamin D 2000 units Caps Take 1 capsule (2,000 Units total) by mouth daily before breakfast.       Diet: routine diet  Activity: Advance as tolerated. Pelvic rest for 6  weeks.   Outpatient follow up:4 weeks Follow up Appt: Future Appointments  Date Time Provider Department Center  08/22/2017  3:45 PM Orvilla Cornwall A, CNM [facility] None   Follow up Visit:No Follow-up on file.  Postpartum contraception: None; patient educated and handout given  Newborn Data: Live born female  Birth Weight: 5 lb 15.1 oz (2696 g) APGAR: 8, 9  Newborn Delivery   Birth date/time:  08/19/2017 23:45:00 Delivery type:  Vaginal, Spontaneous     Baby Feeding: Bottle Disposition:NICU   08/21/2017 Caryl Ada, DO

## 2017-08-22 ENCOUNTER — Encounter: Payer: Medicaid Other | Admitting: Certified Nurse Midwife

## 2017-08-24 ENCOUNTER — Other Ambulatory Visit: Payer: Self-pay | Admitting: Certified Nurse Midwife

## 2017-08-24 DIAGNOSIS — Z3400 Encounter for supervision of normal first pregnancy, unspecified trimester: Secondary | ICD-10-CM

## 2017-09-19 ENCOUNTER — Ambulatory Visit (INDEPENDENT_AMBULATORY_CARE_PROVIDER_SITE_OTHER): Payer: Medicaid Other | Admitting: Certified Nurse Midwife

## 2017-09-19 ENCOUNTER — Encounter: Payer: Self-pay | Admitting: Certified Nurse Midwife

## 2017-09-19 DIAGNOSIS — Z1389 Encounter for screening for other disorder: Secondary | ICD-10-CM

## 2017-09-19 MED ORDER — IBUPROFEN 800 MG PO TABS: 800.0000 mg | ORAL_TABLET | Freq: Three times a day (TID) | ORAL | 1 refills | Status: DC | PRN

## 2017-09-19 NOTE — Progress Notes (Signed)
.  Post Partum Exam  Diana Mitchell is a 32 y.o. 171P0101 female who presents for a postpartum visit. She is 4 weeks postpartum following a spontaneous vaginal delivery. I have fully reviewed the prenatal and intrapartum course. The delivery was at 36.3 gestational weeks.  Anesthesia: IV sedation. Postpartum course has been good. Baby's course has been good. Baby is feeding by bottle - Similac Advance. Bleeding staining only. Bowel function is normal. Bladder function is normal. Patient is not sexually active. Contraception method is none. Postpartum depression screening:neg  The following portions of the patient's history were reviewed and updated as appropriate: allergies, current medications, past family history, past medical history, past social history, past surgical history and problem list. Last pap smear done 04/03/17 and was Normal  Review of Systems Pertinent items noted in HPI and remainder of comprehensive ROS otherwise negative.    Objective:  unknown if currently breastfeeding.  General:  alert, cooperative and no distress   Breasts:  inspection negative, no nipple discharge or bleeding, no masses or nodularity palpable  Lungs: clear to auscultation bilaterally  Heart:  regular rate and rhythm, S1, S2 normal, no murmur, click, rub or gallop  Abdomen: soft, non-tender; bowel sounds normal; no masses,  no organomegaly  Pelvic/Rectal Exam: Not performed.        Assessment:    normal 4 week postpartum exam. Pap smear not done at today's visit.   Plan:   1. Contraception: abstinence 2. Will call when desires to return to work.  Motrin 800 mg sent to pharmacy for perineal pain.  3. Follow up as needed.

## 2017-09-19 NOTE — Progress Notes (Signed)
Pt states she does not want BC control at this time.  .Marland Kitchen

## 2017-09-20 ENCOUNTER — Ambulatory Visit: Payer: Medicaid Other | Admitting: Certified Nurse Midwife

## 2017-10-23 ENCOUNTER — Emergency Department (HOSPITAL_COMMUNITY): Payer: Medicaid Other

## 2017-10-23 ENCOUNTER — Emergency Department (HOSPITAL_COMMUNITY)
Admission: EM | Admit: 2017-10-23 | Discharge: 2017-10-23 | Disposition: A | Discharge status: Home / Self Care | Payer: Medicaid Other | Attending: Emergency Medicine | Admitting: Emergency Medicine

## 2017-10-23 ENCOUNTER — Encounter (HOSPITAL_COMMUNITY): Payer: Self-pay | Admitting: Emergency Medicine

## 2017-10-23 DIAGNOSIS — R102 Pelvic and perineal pain: Secondary | ICD-10-CM | POA: Diagnosis present

## 2017-10-23 DIAGNOSIS — N3000 Acute cystitis without hematuria: Secondary | ICD-10-CM | POA: Diagnosis not present

## 2017-10-23 DIAGNOSIS — R1030 Lower abdominal pain, unspecified: Secondary | ICD-10-CM | POA: Diagnosis not present

## 2017-10-23 LAB — I-STAT CG4 LACTIC ACID, ED
Lactic Acid, Venous: 1.63 mmol/L (ref 0.5–1.9)
Lactic Acid, Venous: 1.09 mmol/L (ref 0.5–1.9)

## 2017-10-23 LAB — WET PREP, GENITAL
Clue Cells Wet Prep HPF POC: NONE SEEN
Sperm: NONE SEEN
TRICH WET PREP: NONE SEEN
YEAST WET PREP: NONE SEEN

## 2017-10-23 LAB — CBC WITH DIFFERENTIAL/PLATELET
BASOS ABS: 0 10*3/uL (ref 0.0–0.1)
BASOS PCT: 0 %
Eosinophils Absolute: 0.1 10*3/uL (ref 0.0–0.7)
Eosinophils Relative: 1 %
HCT: 34.6 % — ABNORMAL LOW (ref 36.0–46.0)
Hemoglobin: 10.7 g/dL — ABNORMAL LOW (ref 12.0–15.0)
LYMPHS ABS: 3.2 10*3/uL (ref 0.7–4.0)
Lymphocytes Relative: 26 %
MCH: 19.9 pg — AB (ref 26.0–34.0)
MCHC: 30.9 g/dL (ref 30.0–36.0)
MCV: 64.3 fL — ABNORMAL LOW (ref 78.0–100.0)
MONO ABS: 0.5 10*3/uL (ref 0.1–1.0)
Monocytes Relative: 4 %
NEUTROS ABS: 8.5 10*3/uL — AB (ref 1.7–7.7)
Neutrophils Relative %: 69 %
PLATELETS: 327 10*3/uL (ref 150–400)
RBC: 5.38 MIL/uL — ABNORMAL HIGH (ref 3.87–5.11)
RDW: 16.6 % — AB (ref 11.5–15.5)
WBC: 12.3 10*3/uL — ABNORMAL HIGH (ref 4.0–10.5)

## 2017-10-23 LAB — URINALYSIS, ROUTINE W REFLEX MICROSCOPIC
BILIRUBIN URINE: NEGATIVE
GLUCOSE, UA: NEGATIVE mg/dL
HGB URINE DIPSTICK: NEGATIVE
Ketones, ur: NEGATIVE mg/dL
NITRITE: NEGATIVE
PROTEIN: NEGATIVE mg/dL
Specific Gravity, Urine: 1.013 (ref 1.005–1.030)
pH: 6 (ref 5.0–8.0)

## 2017-10-23 LAB — COMPREHENSIVE METABOLIC PANEL
ALBUMIN: 3.8 g/dL (ref 3.5–5.0)
ALK PHOS: 66 U/L (ref 38–126)
ALT: 20 U/L (ref 14–54)
ANION GAP: 10 (ref 5–15)
AST: 19 U/L (ref 15–41)
BUN: 15 mg/dL (ref 6–20)
CALCIUM: 9.3 mg/dL (ref 8.9–10.3)
CHLORIDE: 107 mmol/L (ref 101–111)
CO2: 23 mmol/L (ref 22–32)
Creatinine, Ser: 0.58 mg/dL (ref 0.44–1.00)
GFR calc non Af Amer: >60 mL/min (ref >60)
Glucose, Bld: 110 mg/dL — ABNORMAL HIGH (ref 65–99)
POTASSIUM: 4.1 mmol/L (ref 3.5–5.1)
Sodium: 140 mmol/L (ref 135–145)
Total Bilirubin: 0.5 mg/dL (ref 0.3–1.2)
Total Protein: 7.9 g/dL (ref 6.5–8.1)

## 2017-10-23 LAB — GC/CHLAMYDIA PROBE AMP (~~LOC~~) NOT AT ARMC
Chlamydia: NEGATIVE
Neisseria Gonorrhea: NEGATIVE

## 2017-10-23 LAB — POC URINE PREG, ED: Preg Test, Ur: NEGATIVE

## 2017-10-23 LAB — LIPASE, BLOOD: Lipase: 33 U/L (ref 11–51)

## 2017-10-23 MED ORDER — ONDANSETRON HCL 4 MG/2ML IJ SOLN
4.0000 mg | Freq: Once | INTRAMUSCULAR | Status: AC
  Administered 2017-10-23: 4 mg via INTRAVENOUS
  Filled 2017-10-23: qty 2

## 2017-10-23 MED ORDER — PHENAZOPYRIDINE HCL 200 MG PO TABS: 200.0000 mg | ORAL_TABLET | Freq: Three times a day (TID) | ORAL | 0 refills | Status: DC | PRN

## 2017-10-23 MED ORDER — CEPHALEXIN 500 MG PO CAPS: 500.0000 mg | ORAL_CAPSULE | Freq: Four times a day (QID) | ORAL | 0 refills | Status: DC

## 2017-10-23 MED ORDER — CEFTRIAXONE SODIUM 1 G IJ SOLR
1.0000 g | Freq: Once | INTRAMUSCULAR | Status: AC
  Administered 2017-10-23: 1 g via INTRAVENOUS
  Filled 2017-10-23: qty 10

## 2017-10-23 NOTE — Discharge Instructions (Addendum)
1. Medications: Keflex, pyridium, usual home medications 2. Treatment: rest, drink plenty of fluids, take medications as prescribed 3. Follow Up: Please followup with your primary doctor in 3 days for discussion of your diagnoses and further evaluation after today's visit; if you do not have a primary care doctor use the resource guide provided to find one; return to the ER for fevers, persistent vomiting, worsening abdominal pain or other concerning symptoms.  

## 2017-10-23 NOTE — ED Triage Notes (Signed)
Pt brought in by EMS from home with c/o abd pain  Pt is 2 mths postpartum  Last night pt started having abd pain  Pt took some ibuprofen and laid down to rest and the pain improved  Today the pain came back and she tried the ibuprofen and rest but the pain did not go away  Pt states the pain is worse with movement  Pt c/o nausea when they reached the hospital

## 2017-10-23 NOTE — ED Provider Notes (Addendum)
[facility]-EMERGENCY DEPT Provider Note   CSN: [id number] Arrival date & time: 10/23/17  0003     History   Chief Complaint Chief Complaint  Patient presents with  . Abdominal Pain    HPI Diana Mitchell is a 32 y.o. female G1P0101 with a hx of No major medical problems presents to the Emergency Department complaining of gradual, persistent, progressively worsening lower abdominal pain and vaginal pain onset 4 hours ago.  Patient reports last night she took ibuprofen with moderate relief but the pain has worsened throughout today.  Patient with spontaneous vaginal delivery on 08/19/2017 at [redacted]w[redacted]d with first-degree laceration.  Patient reports her vaginal bleeding stopped a proximally 1 week after delivery and has not returned.  She reports that her pain had also improved but then returned last night.  She denies fevers or chills, nausea or vomiting, constipation, headache, neck pain, chest pain, weakness, dizziness, syncope, dysuria, hematuria, urinary urgency, urinary frequency.  Record review shows that patient had 4-week postpartum follow-up on 09/19/2017.  She complained of perineal pain at that time but no pelvic or rectal exam was performed.  She was discharged with 800 mg of Motrin.  At that visit, patient reported she was not sexually active and did not want birth control at this time.  The history is provided by the patient and medical records. No language interpreter was used.    Past Medical History:  Diagnosis Date  . Medical history non-contributory     Patient Active Problem List   Diagnosis Date Noted  . Vitamin D deficiency 04/14/2017    Past Surgical History:  Procedure Laterality Date  . NO PAST SURGERIES       OB History    Gravida  1   Para  1   Term      Preterm  1   AB      Living  1     SAB      TAB      Ectopic      Multiple  0   Live Births  1            Home Medications    Prior to Admission medications     Medication Sig Start Date End Date Taking? Authorizing Provider  ibuprofen (ADVIL,MOTRIN) 800 MG tablet Take 1 tablet (800 mg total) by mouth every 8 (eight) hours as needed. Patient taking differently: Take 800 mg by mouth every 8 (eight) hours as needed for headache, mild pain or moderate pain.  09/19/17  Yes Denney, Rachelle A, CNM  cephALEXin (KEFLEX) 500 MG capsule Take 1 capsule (500 mg total) by mouth 4 (four) times daily. 10/23/17   Mishel Sans, Dahlia Client, PA-C  Cholecalciferol (VITAMIN D) 2000 units CAPS Take 1 capsule (2,000 Units total) by mouth daily before breakfast. Patient not taking: Reported on 09/19/2017 04/06/17   Brock Bad, MD  phenazopyridine (PYRIDIUM) 200 MG tablet Take 1 tablet (200 mg total) by mouth 3 (three) times daily as needed for pain. 10/23/17   Rendell Thivierge, Dahlia Client, PA-C  Prenatal-DSS-FeCb-FeGl-FA (CITRANATAL BLOOM) 90-1 MG TABS Take 1 tablet by mouth daily. Patient not taking: Reported on 09/19/2017 06/29/17   Roe Coombs, CNM    Family History Family History  Problem Relation Age of Onset  . Heart disease Mother   . Diabetes Father     Social History Social History   Tobacco Use  . Smoking status: Never Smoker  . Smokeless tobacco: Never Used  Substance Use  Topics  . Alcohol use: No  . Drug use: No     Allergies   Patient has no known allergies.   Review of Systems Review of Systems  Constitutional: Negative for appetite change, diaphoresis, fatigue, fever and unexpected weight change.  HENT: Negative for mouth sores.   Eyes: Negative for visual disturbance.  Respiratory: Negative for cough, chest tightness, shortness of breath and wheezing.   Cardiovascular: Negative for chest pain.  Gastrointestinal: Positive for abdominal pain. Negative for constipation, diarrhea, nausea and vomiting.  Endocrine: Negative for polydipsia, polyphagia and polyuria.  Genitourinary: Positive for vaginal pain. Negative for dysuria, frequency,  hematuria and urgency.  Musculoskeletal: Negative for back pain and neck stiffness.  Skin: Negative for rash.  Allergic/Immunologic: Negative for immunocompromised state.  Neurological: Negative for syncope, light-headedness and headaches.  Hematological: Does not bruise/bleed easily.  Psychiatric/Behavioral: Negative for sleep disturbance. The patient is not nervous/anxious.      Physical Exam Updated Vital Signs BP (!) 122/95 (BP Location: Left Arm)   Pulse (!) 104   Temp 98.1 F (36.7 C) (Oral)   Resp 20   SpO2 99%   Physical Exam  Constitutional: She appears well-developed and well-nourished. No distress.  Awake, alert, nontoxic appearance  HENT:  Head: Normocephalic and atraumatic.  Mouth/Throat: Oropharynx is clear and moist. No oropharyngeal exudate.  Eyes: Conjunctivae are normal. No scleral icterus.  Neck: Normal range of motion. Neck supple.  Cardiovascular: Normal rate, regular rhythm, normal heart sounds and intact distal pulses.  No murmur heard. Pulmonary/Chest: Effort normal and breath sounds normal. No respiratory distress. She has no wheezes.  Equal chest expansion  Abdominal: Soft. Bowel sounds are normal. She exhibits no mass. There is tenderness in the suprapubic area. There is no rigidity, no rebound, no guarding and no CVA tenderness. Hernia confirmed negative in the right inguinal area and confirmed negative in the left inguinal area.  Genitourinary: Uterus normal. No labial fusion. There is no rash, tenderness or lesion on the right labia. There is no rash, tenderness or lesion on the left labia. Uterus is not deviated, not enlarged, not fixed and not tender. Cervix exhibits no motion tenderness. Right adnexum displays no mass, no tenderness and no fullness. Left adnexum displays tenderness. Left adnexum displays no mass and no fullness. No erythema, tenderness or bleeding in the vagina. No foreign body in the vagina. No signs of injury around the vagina.  Vaginal discharge (Thin, white) found.  Genitourinary Comments: Left adnexal tenderness without fullness or mass.  No cervical motion tenderness Delivery laceration appears to be healed.  Musculoskeletal: Normal range of motion. She exhibits no edema.  Lymphadenopathy:       Right: No inguinal adenopathy present.       Left: No inguinal adenopathy present.  Neurological: She is alert.  Speech is clear and goal oriented Moves extremities without ataxia  Skin: Skin is warm and dry. She is not diaphoretic. No erythema.  Psychiatric: Her mood appears anxious.  Nursing note and vitals reviewed.    ED Treatments / Results  Labs (all labs ordered are listed, but only abnormal results are displayed) Labs Reviewed  WET PREP, GENITAL - Abnormal; Notable for the following components:      Result Value   WBC, Wet Prep HPF POC FEW (*)    All other components within normal limits  CBC WITH DIFFERENTIAL/PLATELET - Abnormal; Notable for the following components:   WBC 12.3 (*)    RBC 5.38 (*)    Hemoglobin  10.7 (*)    HCT 34.6 (*)    MCV 64.3 (*)    MCH 19.9 (*)    RDW 16.6 (*)    Neutro Abs 8.5 (*)    All other components within normal limits  COMPREHENSIVE METABOLIC PANEL - Abnormal; Notable for the following components:   Glucose, Bld 110 (*)    All other components within normal limits  URINALYSIS, ROUTINE W REFLEX MICROSCOPIC - Abnormal; Notable for the following components:   APPearance CLOUDY (*)    Leukocytes, UA LARGE (*)    Bacteria, UA MANY (*)    Squamous Epithelial / LPF 6-30 (*)    All other components within normal limits  URINE CULTURE  LIPASE, BLOOD  POC URINE PREG, ED  I-STAT CG4 LACTIC ACID, ED  I-STAT CG4 LACTIC ACID, ED  GC/CHLAMYDIA PROBE AMP ([place]) NOT AT [facility]     Radiology US Transvaginal Non-ob  Result Date: 10/23/2017 CLINICAL DATA:  LEFT pelvic pain.  Two months postpartum. EXAM: TRANSABDOMINAL AND TRANSVAGINAL ULTRASOUND OF PELVIS DOPPLER  ULTRASOUND OF OVARIES TECHNIQUE: Both transabdominal and transvaginal ultrasound examinations of the pelvis were performed. Transabdominal technique was performed for global imaging of the pelvis including uterus, ovaries, adnexal regions, and pelvic cul-de-sac. It was necessary to proceed with endovaginal exam following the transabdominal exam to visualize the endometrium and adnexa. Color and duplex Doppler ultrasound was utilized to evaluate blood flow to the ovaries. COMPARISON:  None. FINDINGS: Uterus Measurements: 6.2 x 3.9 x 4.9 cm. No fibroids or other mass visualized. Endometrium Thickness: 9 mm.  No focal abnormality visualized. Right ovary Measurements: 3.3 x 1.5 x 2.3 cm. Normal appearance/no adnexal mass. Left ovary Measurements: 3 x 1.5 x 2.1 cm.  Normal appearance/no adnexal mass. Pulsed Doppler evaluation of both ovaries demonstrates normal low-resistance arterial and venous waveforms. Other findings No abnormal free fluid. IMPRESSION: Normal pelvic ultrasound. Electronically Signed   By: Awilda Metro M.D.   On: 10/23/2017 04:32   US Pelvis Complete  Result Date: 10/23/2017 CLINICAL DATA:  LEFT pelvic pain.  Two months postpartum. EXAM: TRANSABDOMINAL AND TRANSVAGINAL ULTRASOUND OF PELVIS DOPPLER ULTRASOUND OF OVARIES TECHNIQUE: Both transabdominal and transvaginal ultrasound examinations of the pelvis were performed. Transabdominal technique was performed for global imaging of the pelvis including uterus, ovaries, adnexal regions, and pelvic cul-de-sac. It was necessary to proceed with endovaginal exam following the transabdominal exam to visualize the endometrium and adnexa. Color and duplex Doppler ultrasound was utilized to evaluate blood flow to the ovaries. COMPARISON:  None. FINDINGS: Uterus Measurements: 6.2 x 3.9 x 4.9 cm. No fibroids or other mass visualized. Endometrium Thickness: 9 mm.  No focal abnormality visualized. Right ovary Measurements: 3.3 x 1.5 x 2.3 cm. Normal  appearance/no adnexal mass. Left ovary Measurements: 3 x 1.5 x 2.1 cm.  Normal appearance/no adnexal mass. Pulsed Doppler evaluation of both ovaries demonstrates normal low-resistance arterial and venous waveforms. Other findings No abnormal free fluid. IMPRESSION: Normal pelvic ultrasound. Electronically Signed   By: Awilda Metro M.D.   On: 10/23/2017 04:32   [place] Art/ven Flow Abd Pelv Doppler  Result Date: 10/23/2017 CLINICAL DATA:  LEFT pelvic pain.  Two months postpartum. EXAM: TRANSABDOMINAL AND TRANSVAGINAL ULTRASOUND OF PELVIS DOPPLER ULTRASOUND OF OVARIES TECHNIQUE: Both transabdominal and transvaginal ultrasound examinations of the pelvis were performed. Transabdominal technique was performed for global imaging of the pelvis including uterus, ovaries, adnexal regions, and pelvic cul-de-sac. It was necessary to proceed with endovaginal exam following the transabdominal exam to visualize the endometrium  and adnexa. Color and duplex Doppler ultrasound was utilized to evaluate blood flow to the ovaries. COMPARISON:  None. FINDINGS: Uterus Measurements: 6.2 x 3.9 x 4.9 cm. No fibroids or other mass visualized. Endometrium Thickness: 9 mm.  No focal abnormality visualized. Right ovary Measurements: 3.3 x 1.5 x 2.3 cm. Normal appearance/no adnexal mass. Left ovary Measurements: 3 x 1.5 x 2.1 cm.  Normal appearance/no adnexal mass. Pulsed Doppler evaluation of both ovaries demonstrates normal low-resistance arterial and venous waveforms. Other findings No abnormal free fluid. IMPRESSION: Normal pelvic ultrasound. Electronically Signed   By: Awilda Metro M.D.   On: 10/23/2017 04:32    Procedures Procedures (including critical care time)  Medications Ordered in ED Medications  ondansetron (ZOFRAN) injection 4 mg (4 mg Intravenous Given 10/23/17 0116)  cefTRIAXone (ROCEPHIN) 1 g in sodium chloride 0.9 % 100 mL IVPB (1 g Intravenous New Bag/Given 10/23/17 0313)     Initial Impression / Assessment  and Plan / ED Course  I have reviewed the triage vital signs and the nursing notes.  Pertinent labs & imaging results that were available during my care of the patient were reviewed by me and considered in my medical decision making (see chart for details).  Clinical Course as of Oct 24 515  Mon Oct 23, 2017  1610 Mild leukocytosis.  WBC(!): 12.3 [HM]  0516 Leukocytes, white blood cells and bacteria consistent with urinary tract infection  Leukocytes, UA(!): LARGE [HM]  0516 Anemia, baseline since her delivery.  Hemoglobin(!): 10.7 [HM]  0517 Patient tachycardic on arrival however I believe this is secondary to anxiety.  She was not tachycardic on my clinical exam.  Pulse Rate(!): 108 [HM]  0517 Few white blood cells but no clue cells.  Highly doubt STD.  No evidence of bacterial vaginosis.  WBC, Wet Prep HPF POC(!): FEW [HM]    Clinical Course User Index [HM] Cortez Flippen, Boyd Kerbs    Presents with lower abdominal pain.  She denies all urinary symptoms however UA consistent with urinary tract infection.  Pelvic exam with discharge but no blood.  No cervical motion tenderness mild left adnexal tenderness.  Ultrasound without acute abnormalities including no evidence of retained products of conception, ovarian torsion, ovarian cyst, tubo-ovarian abscess.  Pregnancy test is negative.  No CVA tenderness and patient is afebrile.  Doubt pyelonephritis.  Patient given Rocephin here in the emergency department.  Urine culture sent.  Will be discharged home with Keflex and Pyridium.  She is not breast-feeding.  Discussed the importance of close follow-up with her OB/GYN over the next 2-3 days for further evaluation.  Also discussed reasons to return immediately to the emergency department for new or worsening symptoms. Final Clinical Impressions(s) / ED Diagnoses   Final diagnoses:  Acute cystitis without hematuria    ED Discharge Orders        Ordered    cephALEXin (KEFLEX) 500 MG  capsule  4 times daily     10/23/17 0512    phenazopyridine (PYRIDIUM) 200 MG tablet  3 times daily PRN     10/23/17 0512       Renne Platts, Dahlia Client, PA-C 10/23/17 0516    Kyesha Balla, Dahlia Client, PA-C 10/23/17 0518    Palumbo, April, MD 10/23/17 [phone]

## 2017-10-24 LAB — URINE CULTURE

## 2018-03-27 ENCOUNTER — Emergency Department (HOSPITAL_COMMUNITY): Payer: Medicaid Other

## 2018-03-27 ENCOUNTER — Other Ambulatory Visit: Payer: Self-pay

## 2018-03-27 ENCOUNTER — Encounter (HOSPITAL_COMMUNITY): Payer: Self-pay | Admitting: Emergency Medicine

## 2018-03-27 ENCOUNTER — Emergency Department (HOSPITAL_COMMUNITY)
Admission: EM | Admit: 2018-03-27 | Discharge: 2018-03-27 | Discharge status: Against Medical Advice | Payer: Medicaid Other | Attending: Emergency Medicine | Admitting: Emergency Medicine

## 2018-03-27 DIAGNOSIS — Z5321 Procedure and treatment not carried out due to patient leaving prior to being seen by health care provider: Secondary | ICD-10-CM | POA: Insufficient documentation

## 2018-03-27 DIAGNOSIS — R079 Chest pain, unspecified: Secondary | ICD-10-CM | POA: Insufficient documentation
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
## 2018-03-27 NOTE — ED Notes (Signed)
Per registration  Pt left the department.

## 2018-03-27 NOTE — ED Notes (Signed)
Patient refused lab draw @ this time. States when "I was pregnant my blood work was fine so I do not need any more."
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
## 2018-04-21 ENCOUNTER — Emergency Department (HOSPITAL_COMMUNITY)
Admission: EM | Admit: 2018-04-21 | Discharge: 2018-04-21 | Disposition: A | Discharge status: Home / Self Care | Payer: Medicaid Other | Attending: Emergency Medicine | Admitting: Emergency Medicine

## 2018-04-21 ENCOUNTER — Encounter (HOSPITAL_COMMUNITY): Payer: Self-pay | Admitting: *Deleted

## 2018-04-21 DIAGNOSIS — Z79899 Other long term (current) drug therapy: Secondary | ICD-10-CM | POA: Insufficient documentation

## 2018-04-21 DIAGNOSIS — Z608 Other problems related to social environment: Secondary | ICD-10-CM | POA: Insufficient documentation

## 2018-04-21 DIAGNOSIS — Z658 Other specified problems related to psychosocial circumstances: Secondary | ICD-10-CM

## 2018-04-21 LAB — I-STAT BETA HCG BLOOD, ED (MC, WL, AP ONLY): I-stat hCG, quantitative: <5 m[IU]/mL (ref <5)
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
## 2018-12-21 IMAGING — CR DG CHEST 2V
2 series · 2 of 2 positions shown · non-contrast
Comparison: None

CLINICAL DATA: Chest pain for 1 month.

EXAM:
CHEST - 2 VIEW

[w chest pa]
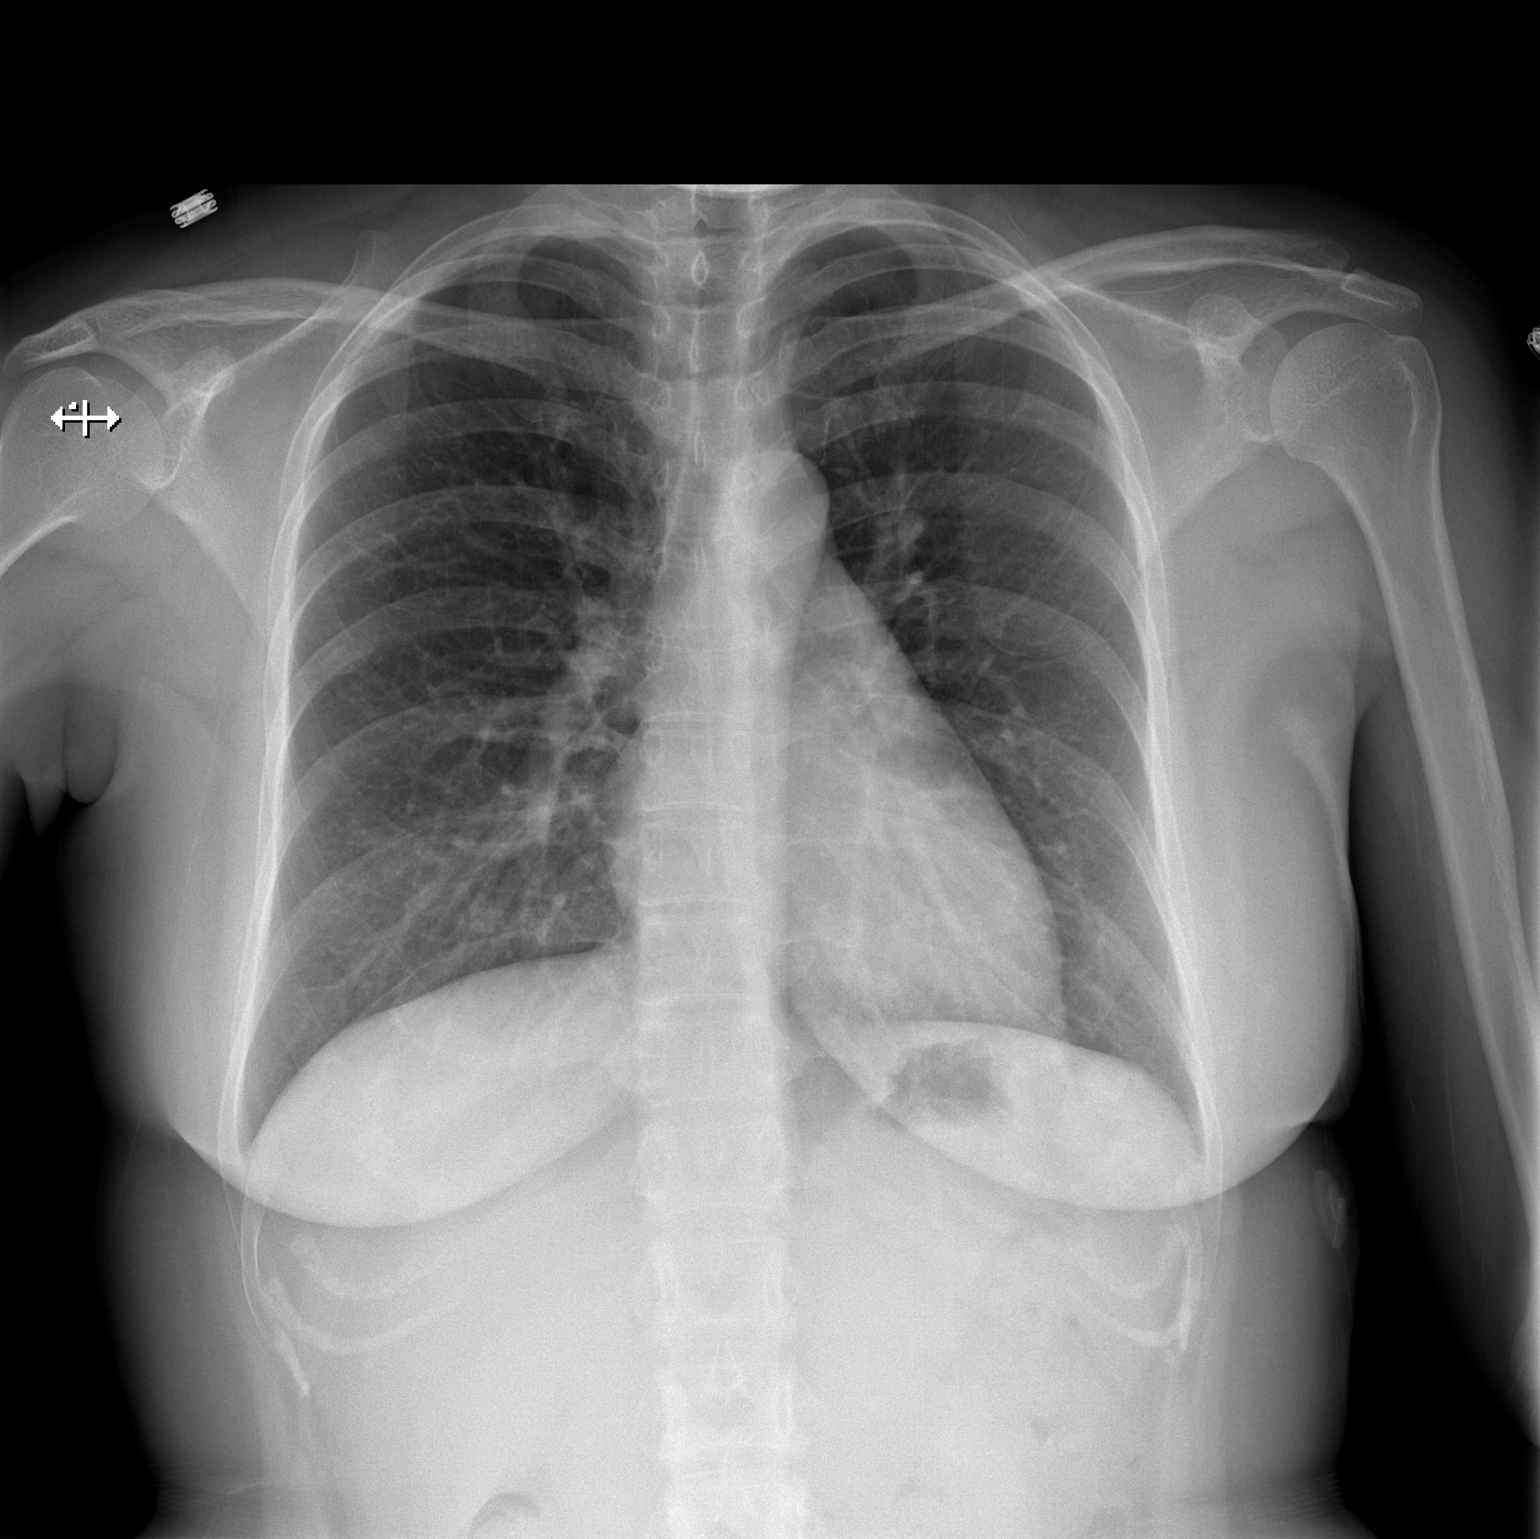

[w chest lat]
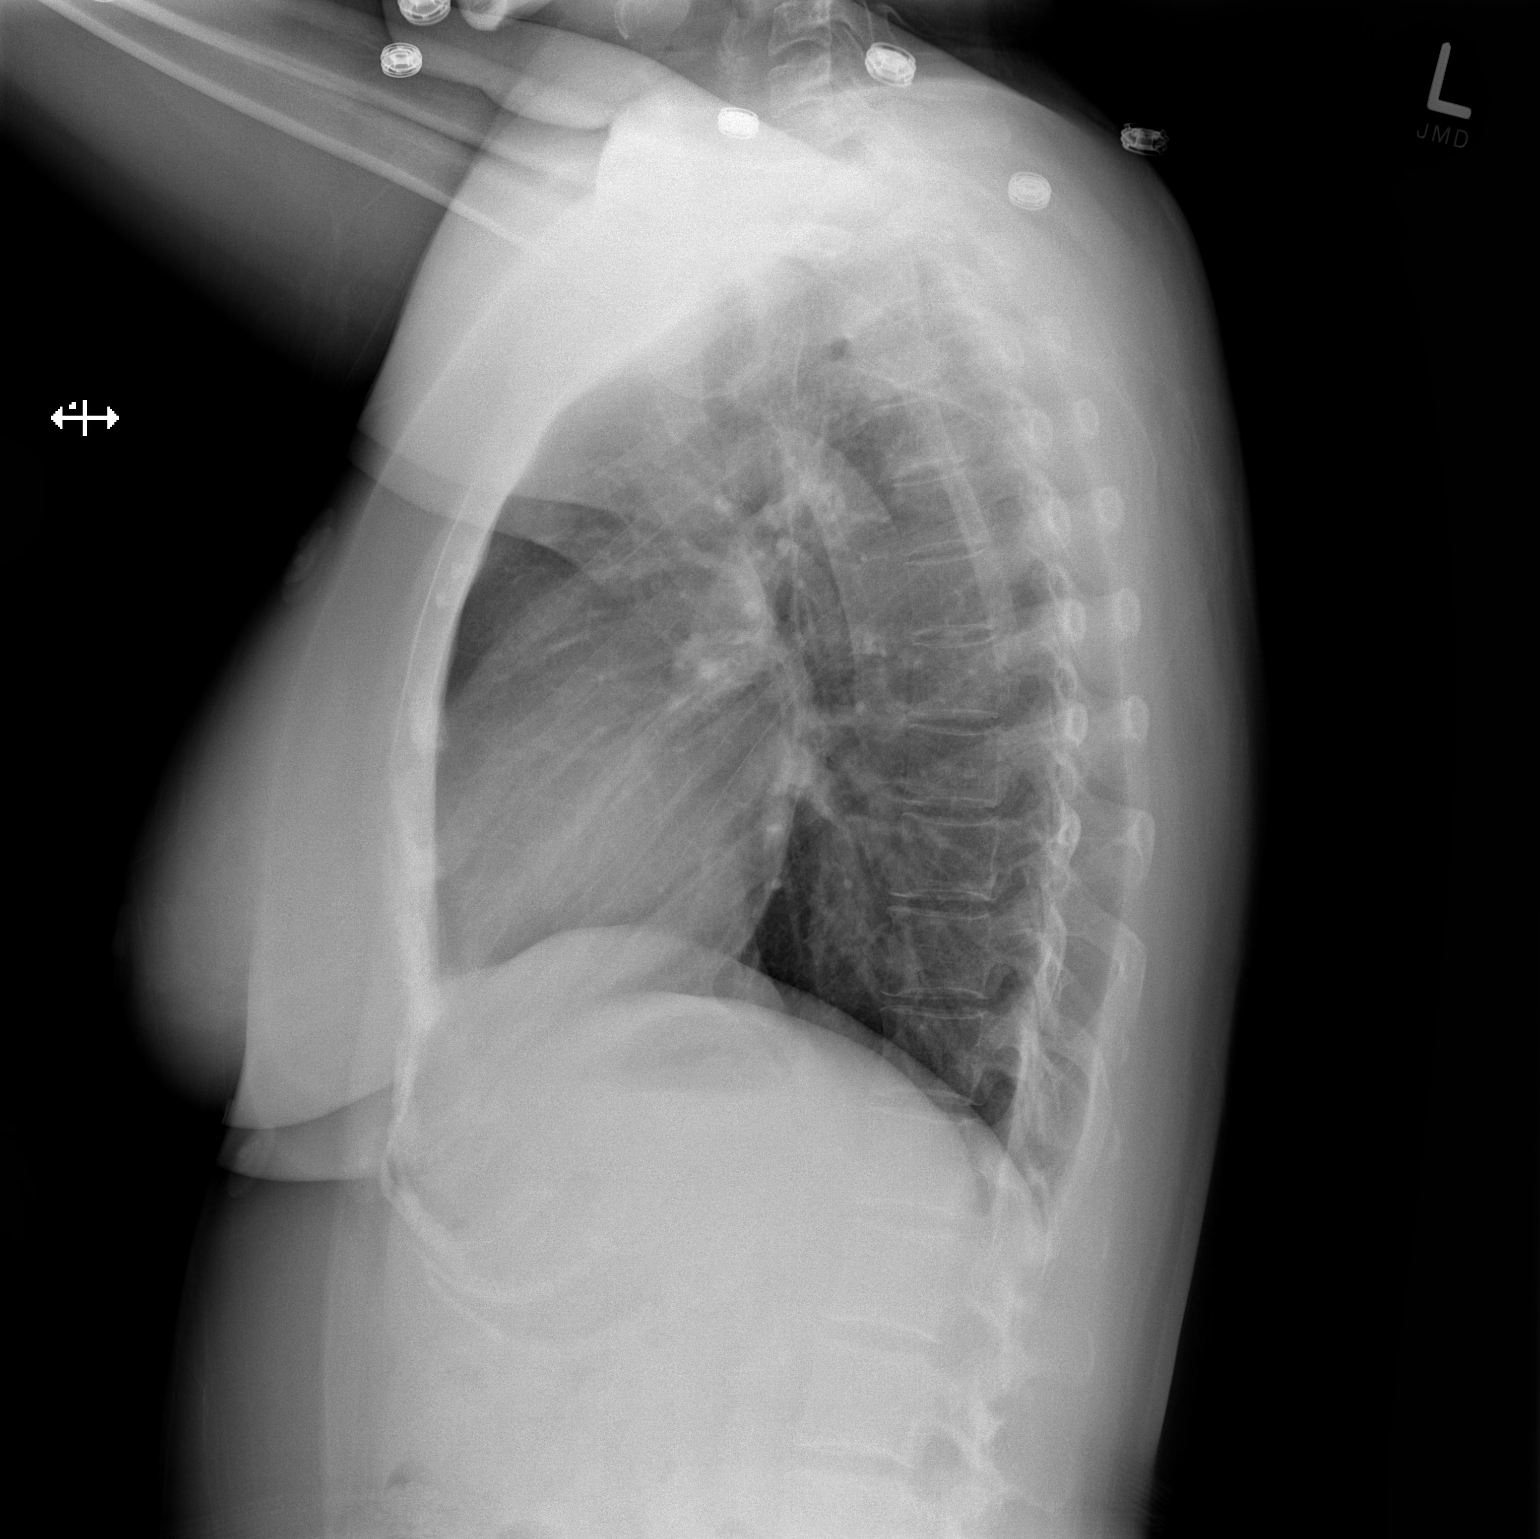

[2 of 2 positions shown; findings below may reference images not displayed]

FINDINGS: Normal heart size. No pleural effusion or edema. The lungs appear
clear.

The visualized osseous structures are unremarkable.
IMPRESSION: 1. No active cardiopulmonary abnormalities.

## 2020-09-08 ENCOUNTER — Emergency Department (HOSPITAL_COMMUNITY): Payer: Self-pay

## 2020-09-08 ENCOUNTER — Emergency Department (HOSPITAL_COMMUNITY)
Admission: EM | Admit: 2020-09-08 | Discharge: 2020-09-08 | Disposition: A | Discharge status: Home / Self Care | Payer: Self-pay | Attending: Emergency Medicine | Admitting: Emergency Medicine

## 2020-09-08 DIAGNOSIS — Z6379 Other stressful life events affecting family and household: Secondary | ICD-10-CM | POA: Insufficient documentation

## 2020-09-08 DIAGNOSIS — T7491XA Unspecified adult maltreatment, confirmed, initial encounter: Secondary | ICD-10-CM

## 2020-09-08 DIAGNOSIS — R42 Dizziness and giddiness: Secondary | ICD-10-CM | POA: Insufficient documentation

## 2020-09-08 DIAGNOSIS — R519 Headache, unspecified: Secondary | ICD-10-CM | POA: Insufficient documentation

## 2020-09-08 DIAGNOSIS — T7411XA Adult physical abuse, confirmed, initial encounter: Secondary | ICD-10-CM | POA: Insufficient documentation

## 2020-09-08 DIAGNOSIS — Z8659 Personal history of other mental and behavioral disorders: Secondary | ICD-10-CM

## 2020-09-08 LAB — CBC
HCT: 41.4 % (ref 36.0–46.0)
Hemoglobin: 12.4 g/dL (ref 12.0–15.0)
MCH: 19.8 pg — ABNORMAL LOW (ref 26.0–34.0)
MCHC: 30 g/dL (ref 30.0–36.0)
MCV: 66 fL — ABNORMAL LOW (ref 80.0–100.0)
Platelets: 233 10*3/uL (ref 150–400)
RBC: 6.27 MIL/uL — ABNORMAL HIGH (ref 3.87–5.11)
RDW: 17.3 % — ABNORMAL HIGH (ref 11.5–15.5)
WBC: 11.4 10*3/uL — ABNORMAL HIGH (ref 4.0–10.5)
nRBC: 0 % (ref 0.0–0.2)

## 2020-09-08 LAB — URINALYSIS, ROUTINE W REFLEX MICROSCOPIC
Bilirubin Urine: NEGATIVE
Glucose, UA: NEGATIVE mg/dL
Ketones, ur: 20 mg/dL — AB
Nitrite: NEGATIVE
Protein, ur: NEGATIVE mg/dL
Specific Gravity, Urine: 1.017 (ref 1.005–1.030)
pH: 5 (ref 5.0–8.0)

## 2020-09-08 LAB — I-STAT BETA HCG BLOOD, ED (MC, WL, AP ONLY): I-stat hCG, quantitative: <5 m[IU]/mL (ref <5)

## 2020-09-08 LAB — BASIC METABOLIC PANEL
Anion gap: 11 (ref 5–15)
BUN: 9 mg/dL (ref 6–20)
CO2: 21 mmol/L — ABNORMAL LOW (ref 22–32)
Calcium: 9.2 mg/dL (ref 8.9–10.3)
Chloride: 105 mmol/L (ref 98–111)
Creatinine, Ser: 0.57 mg/dL (ref 0.44–1.00)
GFR, Estimated: >60 mL/min (ref >60)
Glucose, Bld: 107 mg/dL — ABNORMAL HIGH (ref 70–99)
Potassium: 4.4 mmol/L (ref 3.5–5.1)
Sodium: 137 mmol/L (ref 135–145)
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
## 2020-09-08 NOTE — ED Provider Notes (Incomplete)
[facility] EMERGENCY DEPARTMENT Provider Note   CSN: [id number] Arrival date & time: 09/08/20  1349     History Chief Complaint  Patient presents with  . Dizziness    Diana Mitchell is a 35 y.o. female.  HPI     Past Medical History:  Diagnosis Date  . Medical history non-contributory     Patient Active Problem List   Diagnosis Date Noted  . Vitamin D deficiency 04/14/2017    Past Surgical History:  Procedure Laterality Date  . NO PAST SURGERIES       OB History    Gravida  1   Para  1   Term      Preterm  1   AB      Living  1     SAB      IAB      Ectopic      Multiple  0   Live Births  1           Family History  Problem Relation Age of Onset  . Heart disease Mother   . Diabetes Father     Social History   Tobacco Use  . Smoking status: Never Smoker  . Smokeless tobacco: Never Used  Substance Use Topics  . Alcohol use: No  . Drug use: No    Home Medications Prior to Admission medications   Medication Sig Start Date End Date Taking? Authorizing Provider  cephALEXin (KEFLEX) 500 MG capsule Take 1 capsule (500 mg total) by mouth 4 (four) times daily. 10/23/17   Muthersbaugh, Dahlia Client, PA-C  Cholecalciferol (VITAMIN D) 2000 units CAPS Take 1 capsule (2,000 Units total) by mouth daily before breakfast. Patient not taking: Reported on 09/19/2017 04/06/17   Brock Bad, MD  ibuprofen (ADVIL,MOTRIN) 800 MG tablet Take 1 tablet (800 mg total) by mouth every 8 (eight) hours as needed. Patient taking differently: Take 800 mg by mouth every 8 (eight) hours as needed for headache, mild pain or moderate pain.  09/19/17   Orvilla Cornwall A, CNM  phenazopyridine (PYRIDIUM) 200 MG tablet Take 1 tablet (200 mg total) by mouth 3 (three) times daily as needed for pain. 10/23/17   Muthersbaugh, Dahlia Client, PA-C  Prenatal-DSS-FeCb-FeGl-FA (CITRANATAL BLOOM) 90-1 MG TABS Take 1 tablet by mouth daily. Patient not taking: Reported on  09/19/2017 06/29/17   Roe Coombs, CNM    Allergies    Patient has no known allergies.  Review of Systems   Review of Systems  Physical Exam Updated Vital Signs BP 102/73   Pulse 83   Temp 98 F (36.7 C) (Oral)   Resp 16   SpO2 100%   Physical Exam  ED Results / Procedures / Treatments   Labs (all labs ordered are listed, but only abnormal results are displayed) Labs Reviewed  BASIC METABOLIC PANEL - Abnormal; Notable for the following components:      Result Value   CO2 21 (*)    Glucose, Bld 107 (*)    All other components within normal limits  CBC - Abnormal; Notable for the following components:   WBC 11.4 (*)    RBC 6.27 (*)    MCV 66.0 (*)    MCH 19.8 (*)    RDW 17.3 (*)    All other components within normal limits  URINALYSIS, ROUTINE W REFLEX MICROSCOPIC - Abnormal; Notable for the following components:   APPearance HAZY (*)    Hgb urine dipstick SMALL (*)  Ketones, ur 20 (*)    Leukocytes,Ua TRACE (*)    Bacteria, UA RARE (*)    All other components within normal limits  I-STAT BETA HCG BLOOD, ED ([facility], [facility], [facility] ONLY)  CBG MONITORING, ED    EKG EKG Interpretation  Date/Time:  Tuesday September 08 2020 13:57:54 EST Ventricular Rate:  80 PR Interval:  106 QRS Duration: 68 QT Interval:  374 QTC Calculation: 431 R Axis:   37 Text Interpretation: Sinus rhythm with short PR Cannot rule out Anterior infarct , age undetermined Abnormal ECG No significant change since last tracing Confirmed by Alvira Monday ([id number]) on 09/08/2020 9:24:15 PM   Radiology No results found.  Procedures Procedures {Remember to document critical care time when appropriate:1}  Medications Ordered in ED Medications - No data to display  ED Course  I have reviewed the triage vital signs and the nursing notes.  Pertinent labs & imaging results that were available during my care of the patient were reviewed by me and considered in my medical decision making (see chart for  details).    MDM Rules/Calculators/A&P                          *** Final Clinical Impression(s) / ED Diagnoses Final diagnoses:  Lightheadedness    Rx / DC Orders ED Discharge Orders    None
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
## 2020-09-09 ENCOUNTER — Encounter: Payer: Self-pay | Admitting: Neurology

## 2020-10-08 ENCOUNTER — Inpatient Hospital Stay: Payer: Medicaid Other | Admitting: Internal Medicine
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
## 2020-11-13 ENCOUNTER — Ambulatory Visit: Payer: Medicaid Other | Admitting: Neurology

## 2020-11-17 ENCOUNTER — Encounter: Payer: Self-pay | Admitting: Pediatrics

## 2020-11-17 DIAGNOSIS — Z818 Family history of other mental and behavioral disorders: Secondary | ICD-10-CM | POA: Insufficient documentation

## 2020-11-17 DIAGNOSIS — Z820 Family history of epilepsy and other diseases of the nervous system: Secondary | ICD-10-CM | POA: Insufficient documentation

## 2020-11-20 ENCOUNTER — Inpatient Hospital Stay: Payer: Medicaid Other | Admitting: Internal Medicine

## 2021-01-18 ENCOUNTER — Encounter: Payer: Self-pay | Admitting: Internal Medicine

## 2021-01-18 ENCOUNTER — Other Ambulatory Visit: Payer: Self-pay

## 2021-01-18 ENCOUNTER — Ambulatory Visit: Payer: Medicaid Other | Attending: Internal Medicine | Admitting: Internal Medicine

## 2021-01-18 VITALS — BP 104/68 | HR 70 | Temp 97.9°F | Wt 141.0 lb

## 2021-01-18 DIAGNOSIS — Z6829 Body mass index (BMI) 29.0-29.9, adult: Secondary | ICD-10-CM

## 2021-01-18 DIAGNOSIS — Z7689 Persons encountering health services in other specified circumstances: Secondary | ICD-10-CM

## 2021-01-18 DIAGNOSIS — E663 Overweight: Secondary | ICD-10-CM | POA: Insufficient documentation

## 2021-01-18 DIAGNOSIS — T7491XA Unspecified adult maltreatment, confirmed, initial encounter: Secondary | ICD-10-CM | POA: Insufficient documentation

## 2021-01-18 DIAGNOSIS — F419 Anxiety disorder, unspecified: Secondary | ICD-10-CM

## 2021-01-18 DIAGNOSIS — R55 Syncope and collapse: Secondary | ICD-10-CM
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
## 2021-01-18 NOTE — Progress Notes (Signed)
F/u lightheadness  Intermittent headache 5/10  Takes tylenol

## 2021-01-18 NOTE — Patient Instructions (Signed)

## 2021-02-03 ENCOUNTER — Ambulatory Visit
Admission: EM | Admit: 2021-02-03 | Discharge: 2021-02-03 | Discharge status: Against Medical Advice | Payer: Medicaid Other

## 2021-02-03 ENCOUNTER — Ambulatory Visit: Payer: Self-pay | Admitting: *Deleted

## 2021-02-03 ENCOUNTER — Other Ambulatory Visit: Payer: Self-pay

## 2021-02-03 NOTE — Telephone Encounter (Signed)
Dizziness for several days. Mostly when getting up from a lying or sitting position. Denies all other symptoms but unable to explain if vertigo is occurring. Drinking 4-5 cups of fluid daily. Has not been sick with any other  illness recently no fever. Care advice including rise slowly, elevate legs when you feel dizziness and do not drive. No availability at pcp this week. Referred patient to the mobile unit. Stated she would probably go today. If she worsens she understands to call  911.Reason for Disposition  [1] MODERATE dizziness (e.g., interferes with normal activities) AND [2] has been evaluated by physician for this  Answer Assessment - Initial Assessment Questions 1. DESCRIPTION: "Describe your dizziness."      2. LIGHTHEADED: "Do you feel lightheaded?" (e.g., somewhat faint, woozy, weak upon standing)     Mostly when she wakes up but throughout the day also. 3. VERTIGO: "Do you feel like either you or the room is spinning or tilting?" (i.e. vertigo)     Could not explain 4. SEVERITY: "How bad is it?"  "Do you feel like you are going to faint?" "Can you stand and walk?"   - MILD: Feels slightly dizzy, but walking normally.   - MODERATE: Feels unsteady when walking, but not falling; interferes with normal activities (e.g., school, work).   - SEVERE: Unable to walk without falling, or requires assistance to walk without falling; feels like passing out now.      Moderately  5. ONSET:  "When did the dizziness begin?"     Several days 6. AGGRAVATING FACTORS: "Does anything make it worse?" (e.g., standing, change in head position)     Yes with head movement 7. HEART RATE: "Can you tell me your heart rate?" "How many beats in 15 seconds?"  (Note: not all patients can do this)       no 8. CAUSE: "What do you think is causing the dizziness?"     unknown 9. RECURRENT SYMPTOM: "Have you had dizziness before?" If Yes, ask: "When was the last time?" "What happened that time?"     no 10. OTHER  SYMPTOMS: "Do you have any other symptoms?" (e.g., fever, chest pain, vomiting, diarrhea, bleeding)       no 11. PREGNANCY: "Is there any chance you are pregnant?" "When was your last menstrual period?"       no  Protocols used: Dizziness - Lightheadedness-A-AH

## 2021-02-15 ENCOUNTER — Telehealth: Payer: Self-pay | Admitting: Internal Medicine

## 2021-02-15 ENCOUNTER — Other Ambulatory Visit: Payer: Self-pay

## 2021-02-15 ENCOUNTER — Ambulatory Visit: Payer: Self-pay | Attending: Internal Medicine

## 2021-02-15 NOTE — Telephone Encounter (Signed)
Error

## 2021-02-15 NOTE — Telephone Encounter (Signed)
T came to the financial appt only with the application and ID not paperwork, she was upset since when the call her to confirm this appt the person did not told her that she need documentation to bring at the time of the appt for qualifying for these programs, she said to destroy the application that she does not want to apply for the financial  programs anymore

## 2021-03-08 ENCOUNTER — Other Ambulatory Visit: Payer: Self-pay

## 2021-03-08 ENCOUNTER — Ambulatory Visit: Payer: Self-pay | Attending: Internal Medicine | Admitting: Nurse Practitioner

## 2021-03-08 NOTE — Progress Notes (Signed)
Patient states she is busy and reschedule.

## 2021-03-16 ENCOUNTER — Ambulatory Visit: Payer: Medicaid Other | Admitting: Internal Medicine

## 2021-06-04 IMAGING — CT CT HEAD W/O CM
3 series · 16 of 47 positions shown, 19 images · non-contrast
Comparison: None.

CLINICAL DATA: Headache, lightheadedness for 1 year

EXAM:
CT HEAD WITHOUT CONTRAST
TECHNIQUE: Contiguous axial images were obtained from the base of the skull
through the vertex without intravenous contrast.

[Series 3: head 5.0 h30s · axial · 0.39mm/px · z∈[-100,+25]mm · 10 of 30 slices shown, 13 images]
[im 3/30  brain]
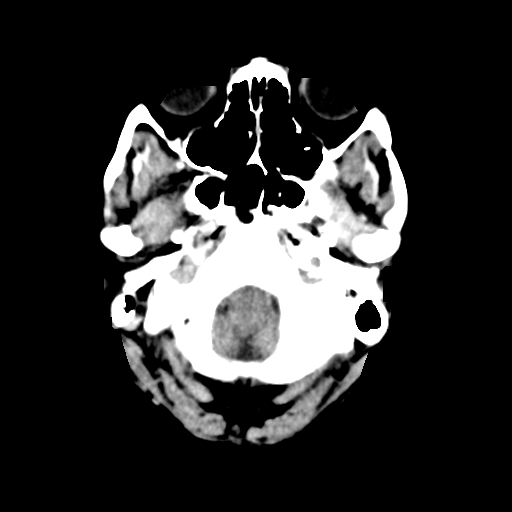
[im 3/30  bone]
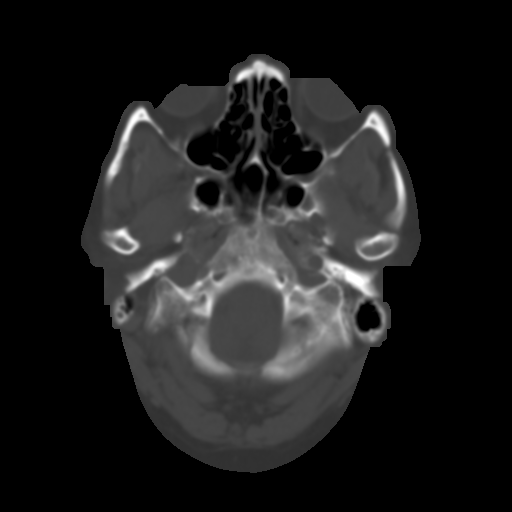
[im 6/30  brain]
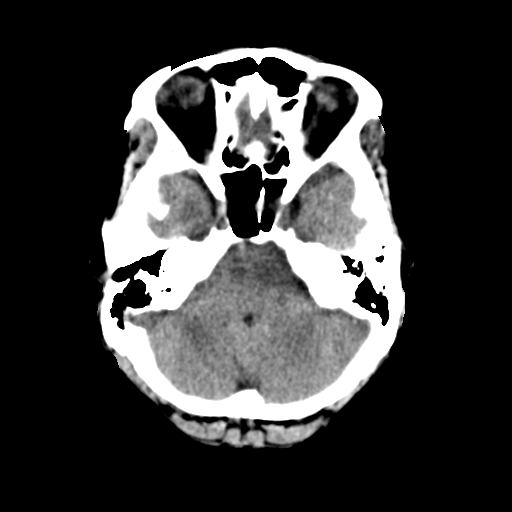
[im 9/30  brain]
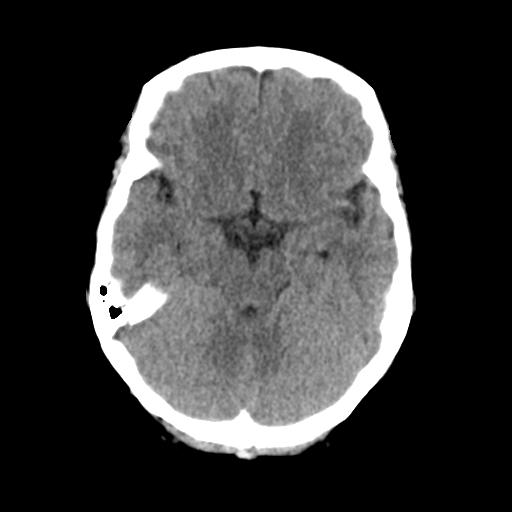
[im 11/30  brain]
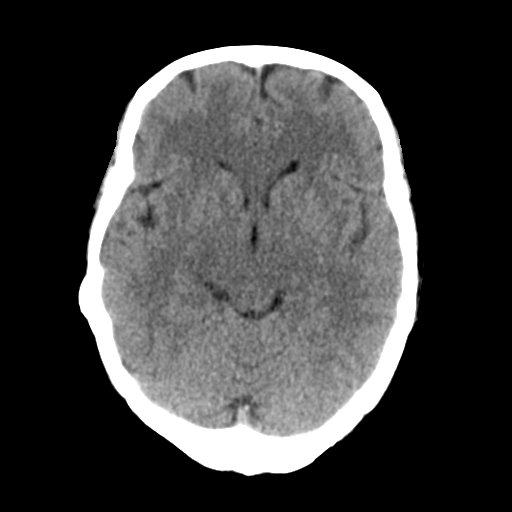
[im 14/30  brain]
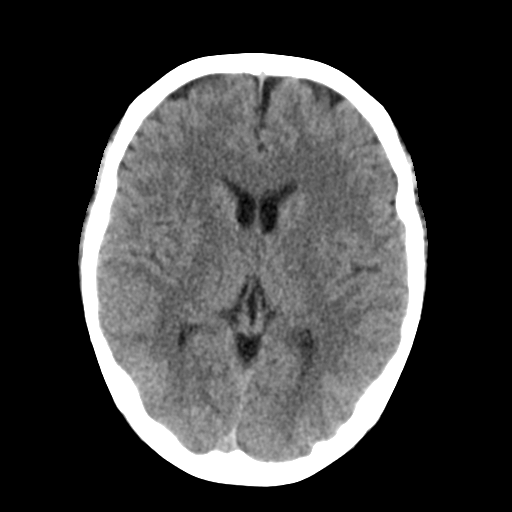
[im 14/30  bone]
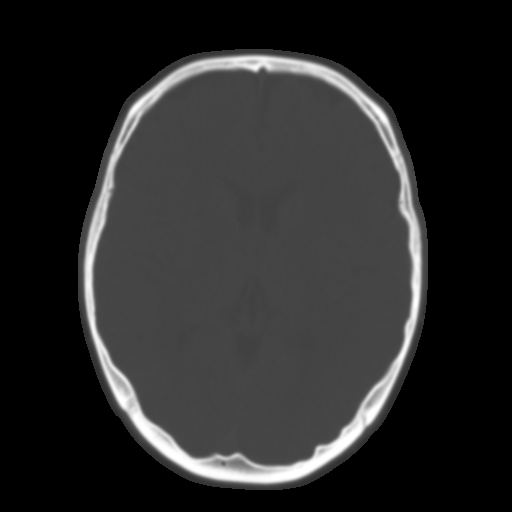
[im 17/30  brain]
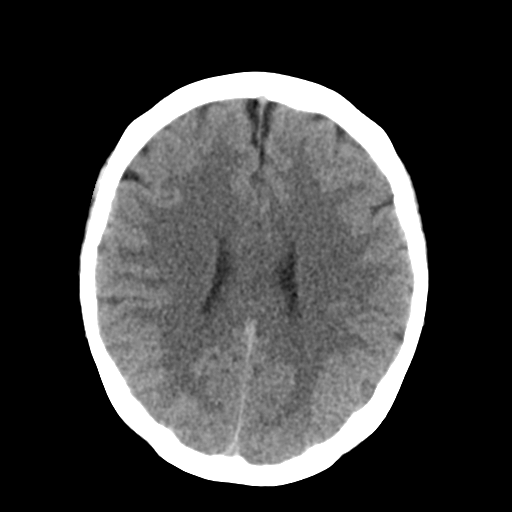
[im 20/30  brain]
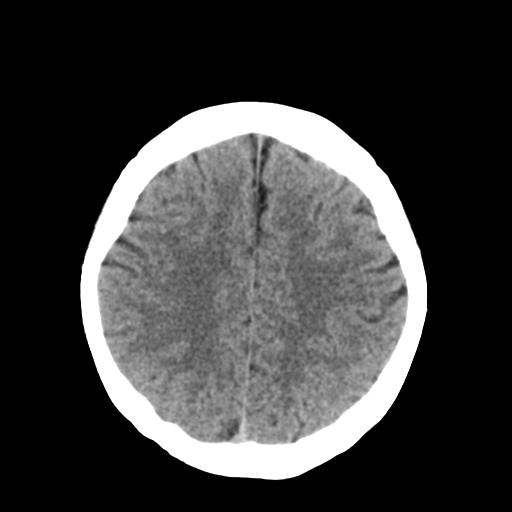
[im 23/30  brain]
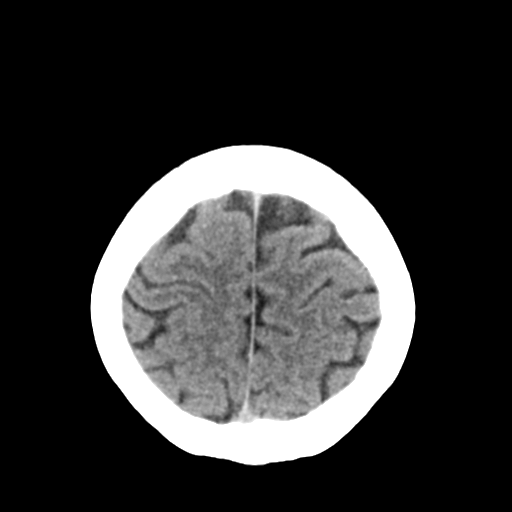
[im 25/30  brain]
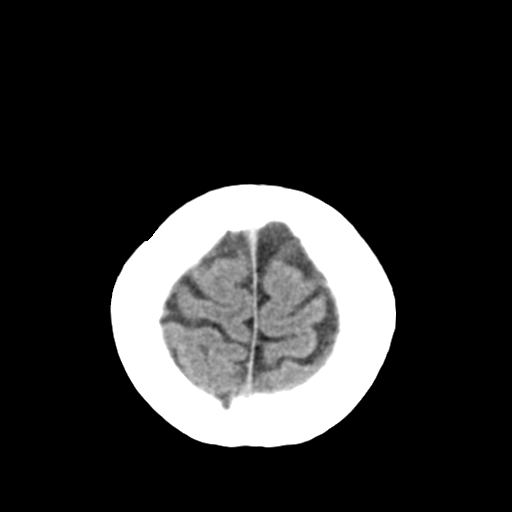
[im 25/30  bone]
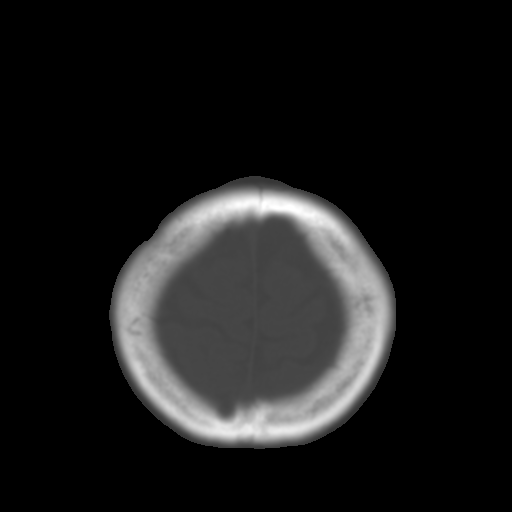
[im 28/30  brain]
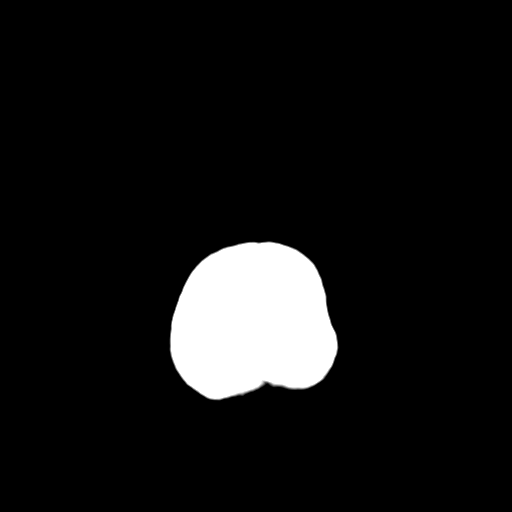

[Series 5: head 3.0 mpr cor · coronal · 0.29mm/px · 3 of 67 slices shown]
[im 23/67  brain]
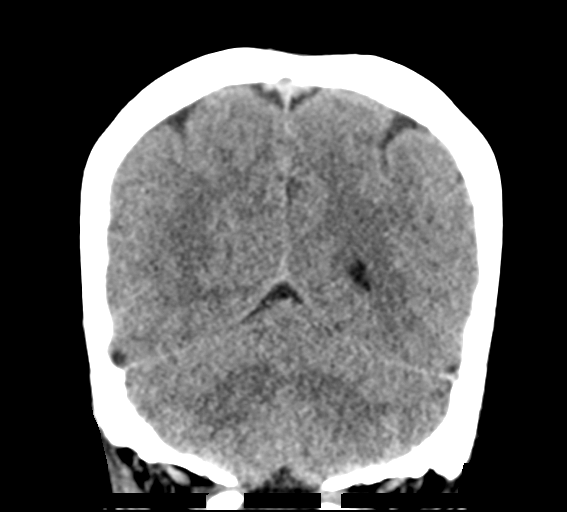
[im 30/67  brain]
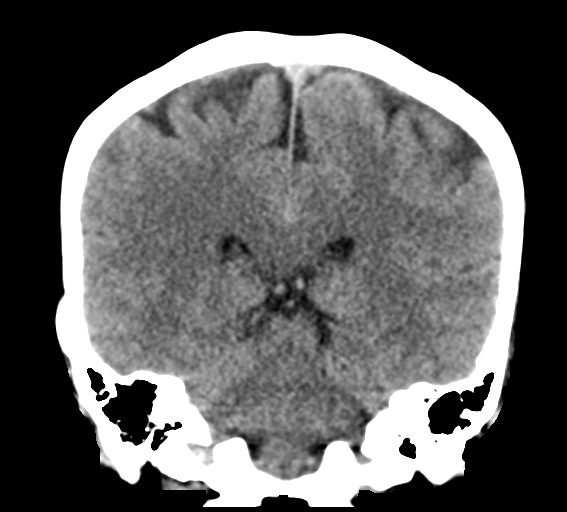
[im 37/67  brain]
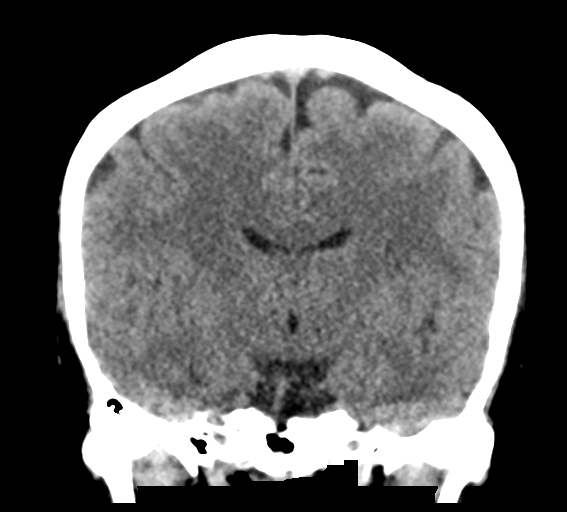

[Series 6: head 3.0 mpr sag · sagittal · 0.29mm/px · 3 of 52 slices shown]
[im 18/52  brain]
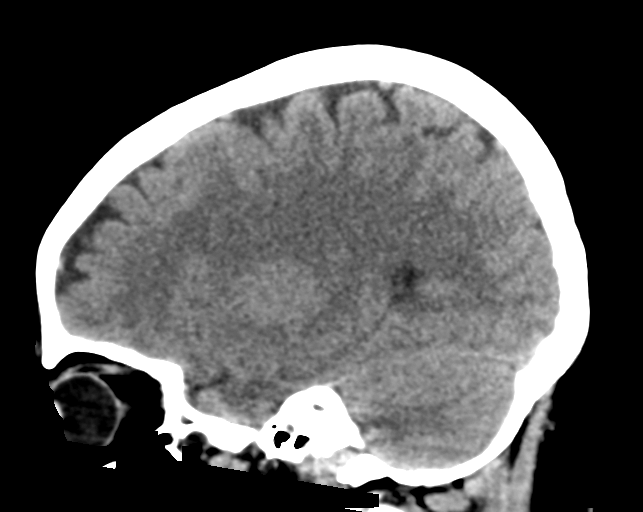
[im 26/52  brain]
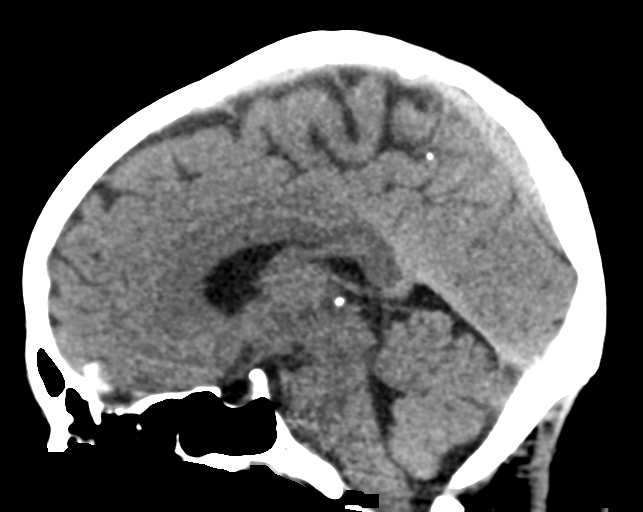
[im 35/52  brain]
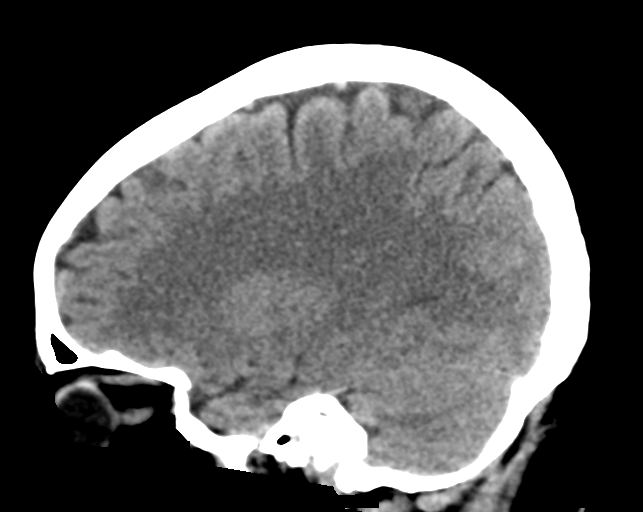

[16 of 47 positions shown; findings below may reference images not displayed]

FINDINGS: Brain: No acute infarct or hemorrhage. Lateral ventricles and
midline structures are unremarkable. No acute extra-axial fluid
collections. No mass effect.

Vascular: No hyperdense vessel or unexpected calcification.

Skull: Normal. Negative for fracture or focal lesion.

Sinuses/Orbits: No acute finding.

Other: None.
IMPRESSION: 1. No acute intracranial process.

## 2021-10-12 ENCOUNTER — Encounter: Payer: Self-pay | Admitting: *Deleted
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
## 2021-10-13 ENCOUNTER — Ambulatory Visit: Payer: Medicaid Other | Admitting: Psychiatry

## 2021-10-13 ENCOUNTER — Encounter: Payer: Self-pay | Admitting: Psychiatry

## 2021-10-13 ENCOUNTER — Telehealth: Payer: Self-pay | Admitting: Psychiatry

## 2021-10-13 VITALS — BP 99/72 | HR 63 | Ht <= 58 in | Wt 141.5 lb

## 2021-10-13 DIAGNOSIS — R519 Headache, unspecified: Secondary | ICD-10-CM

## 2021-10-13 DIAGNOSIS — R55 Syncope and collapse: Secondary | ICD-10-CM

## 2021-10-13 DIAGNOSIS — R42 Dizziness and giddiness: Secondary | ICD-10-CM | POA: Diagnosis not present

## 2021-10-13 DIAGNOSIS — M542 Cervicalgia: Secondary | ICD-10-CM | POA: Diagnosis not present

## 2021-10-13 NOTE — Patient Instructions (Addendum)
Physical therapy for the neck  ?EEG ?MRI brain ?

## 2021-10-13 NOTE — Telephone Encounter (Signed)
MCD healthy blue Diana Mitchell: [id number] (exp. 10/13/21 to 12/11/21) order sent to GI, they will reach out to the pt to schedule.  ?

## 2021-10-19 ENCOUNTER — Ambulatory Visit: Payer: Medicaid Other | Admitting: Neurology

## 2021-10-19 DIAGNOSIS — R55 Syncope and collapse: Secondary | ICD-10-CM | POA: Diagnosis not present

## 2021-10-20 NOTE — Procedures (Signed)
?  ?  ?  History: ? ?35 year old woman with seizure vs. syncope ? ?EEG classification: Awake and drowsy ? ?Description of the recording: The background rhythms of this recording consists of a fairly well modulated medium amplitude alpha rhythm of 9 Hz that is reactive to eye opening and closure. As the record progresses, the patient appears to remain in the waking state throughout the recording. Photic stimulation was performed, did not show any abnormalities. Hyperventilation was also performed, did not show any abnormalities. Toward the end of the recording, the patient enters the drowsy state with slight symmetric slowing seen. The patient never enters stage II sleep. No abnormal epileptiform discharges seen during this recording. There was no focal slowing. EKG monitor shows no evidence of cardiac rhythm abnormalities with a heart rate of 66. ? ?Abnormality: None  ? ?Impression: This is a normal EEG recording in the waking and drowsy state. No evidence of interictal epileptiform discharges seen. A normal EEG does not exclude a diagnosis of epilepsy.  ? ? ?Savio Albrecht, MD ?[facility] ?  ?

## 2021-10-20 NOTE — Progress Notes (Deleted)
? ? ?  History: ? ?36 year old woman with seizure vs. syncope ? ?EEG classification: Awake and drowsy ? ?Description of the recording: The background rhythms of this recording consists of a fairly well modulated medium amplitude alpha rhythm of 9 Hz that is reactive to eye opening and closure. As the record progresses, the patient appears to remain in the waking state throughout the recording. Photic stimulation was performed, did not show any abnormalities. Hyperventilation was also performed, did not show any abnormalities. Toward the end of the recording, the patient enters the drowsy state with slight symmetric slowing seen. The patient never enters stage II sleep. No abnormal epileptiform discharges seen during this recording. There was no focal slowing. EKG monitor shows no evidence of cardiac rhythm abnormalities with a heart rate of 66. ? ?Abnormality: None  ? ?Impression: This is a normal EEG recording in the waking and drowsy state. No evidence of interictal epileptiform discharges seen. A normal EEG does not exclude a diagnosis of epilepsy.  ? ? ?Diana Ran, MD ?[facility] ?  ?

## 2021-10-26 ENCOUNTER — Ambulatory Visit: Payer: Medicaid Other | Attending: Psychiatry

## 2021-10-26 DIAGNOSIS — M542 Cervicalgia: Secondary | ICD-10-CM | POA: Insufficient documentation
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
## 2021-10-26 NOTE — Patient Instructions (Signed)
[facility] at [facility] ? ? ?[address] ?[place],  [place]  [address] ?Get Driving Directions ?Main: [phone] ? ?They will call you to schedule an appointment.  ?
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
## 2021-10-28 ENCOUNTER — Ambulatory Visit: Payer: Medicaid Other

## 2021-10-28 ENCOUNTER — Ambulatory Visit
Admission: RE | Admit: 2021-10-28 | Discharge: 2021-10-28 | Disposition: A | Discharge status: Home / Self Care | Payer: Medicaid Other | Source: Ambulatory Visit | Attending: Psychiatry | Admitting: Psychiatry

## 2021-10-28 ENCOUNTER — Other Ambulatory Visit: Payer: Self-pay | Admitting: Psychiatry

## 2021-10-28 DIAGNOSIS — M542 Cervicalgia: Secondary | ICD-10-CM

## 2021-10-28 DIAGNOSIS — R519 Headache, unspecified: Secondary | ICD-10-CM | POA: Diagnosis not present

## 2021-11-03 ENCOUNTER — Ambulatory Visit: Payer: Medicaid Other | Admitting: Physical Therapy

## 2021-11-11 ENCOUNTER — Ambulatory Visit: Payer: Medicaid Other
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
## 2021-11-17 ENCOUNTER — Ambulatory Visit: Payer: Medicaid Other | Attending: Psychiatry

## 2021-11-17 DIAGNOSIS — M542 Cervicalgia: Secondary | ICD-10-CM | POA: Insufficient documentation
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
## 2021-11-24 ENCOUNTER — Ambulatory Visit: Payer: Medicaid Other

## 2021-12-01 ENCOUNTER — Ambulatory Visit: Payer: Medicaid Other

## 2021-12-01 DIAGNOSIS — M542 Cervicalgia: Secondary | ICD-10-CM
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
## 2021-12-08 ENCOUNTER — Ambulatory Visit: Payer: Medicaid Other

## 2021-12-09 ENCOUNTER — Ambulatory Visit: Payer: Medicaid Other | Attending: Psychiatry

## 2021-12-09 DIAGNOSIS — M542 Cervicalgia: Secondary | ICD-10-CM | POA: Insufficient documentation
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
## 2022-03-01 ENCOUNTER — Ambulatory Visit: Payer: Medicaid Other | Admitting: Psychiatry
